# Patient Record
Sex: Female | Born: 1995 | Race: Black or African American | Hispanic: No | Marital: Single | Smoking: Never smoker
Health system: Southern US, Community
[De-identification: ages and names within clinical notes are randomized; demographics above are authoritative.]

## PROBLEM LIST (undated history)

## (undated) ENCOUNTER — Inpatient Hospital Stay (HOSPITAL_COMMUNITY): Payer: Self-pay

## (undated) DIAGNOSIS — S32401A Unspecified fracture of right acetabulum, initial encounter for closed fracture: Secondary | ICD-10-CM

## (undated) DIAGNOSIS — F1290 Cannabis use, unspecified, uncomplicated: Secondary | ICD-10-CM

## (undated) HISTORY — PX: OTHER SURGICAL HISTORY: SHX169

## (undated) HISTORY — PX: BREAST CYST EXCISION: SHX579

---

## 2016-04-09 ENCOUNTER — Inpatient Hospital Stay (HOSPITAL_COMMUNITY)
Admission: EM | Admit: 2016-04-09 | Discharge: 2016-04-15 | DRG: 516 | Disposition: A | Discharge status: Home Health | Payer: BLUE CROSS/BLUE SHIELD | Attending: Orthopedic Surgery | Admitting: Orthopedic Surgery

## 2016-04-09 ENCOUNTER — Emergency Department (HOSPITAL_COMMUNITY): Payer: BLUE CROSS/BLUE SHIELD

## 2016-04-09 ENCOUNTER — Encounter (HOSPITAL_COMMUNITY): Payer: Self-pay | Admitting: Emergency Medicine

## 2016-04-09 ENCOUNTER — Inpatient Hospital Stay (HOSPITAL_COMMUNITY): Payer: BLUE CROSS/BLUE SHIELD

## 2016-04-09 DIAGNOSIS — M542 Cervicalgia: Secondary | ICD-10-CM | POA: Diagnosis present

## 2016-04-09 DIAGNOSIS — Y92410 Unspecified street and highway as the place of occurrence of the external cause: Secondary | ICD-10-CM | POA: Diagnosis not present

## 2016-04-09 DIAGNOSIS — S32491A Other specified fracture of right acetabulum, initial encounter for closed fracture: Secondary | ICD-10-CM | POA: Diagnosis not present

## 2016-04-09 DIAGNOSIS — S0181XA Laceration without foreign body of other part of head, initial encounter: Secondary | ICD-10-CM | POA: Diagnosis present

## 2016-04-09 DIAGNOSIS — S32409A Unspecified fracture of unspecified acetabulum, initial encounter for closed fracture: Secondary | ICD-10-CM | POA: Diagnosis present

## 2016-04-09 DIAGNOSIS — D62 Acute posthemorrhagic anemia: Secondary | ICD-10-CM | POA: Diagnosis not present

## 2016-04-09 DIAGNOSIS — M25511 Pain in right shoulder: Secondary | ICD-10-CM | POA: Diagnosis present

## 2016-04-09 DIAGNOSIS — S32451A Displaced transverse fracture of right acetabulum, initial encounter for closed fracture: Secondary | ICD-10-CM | POA: Diagnosis not present

## 2016-04-09 DIAGNOSIS — S32401A Unspecified fracture of right acetabulum, initial encounter for closed fracture: Secondary | ICD-10-CM | POA: Diagnosis present

## 2016-04-09 DIAGNOSIS — E559 Vitamin D deficiency, unspecified: Secondary | ICD-10-CM | POA: Diagnosis present

## 2016-04-09 DIAGNOSIS — M79601 Pain in right arm: Secondary | ICD-10-CM | POA: Diagnosis not present

## 2016-04-09 DIAGNOSIS — S32401B Unspecified fracture of right acetabulum, initial encounter for open fracture: Secondary | ICD-10-CM | POA: Diagnosis not present

## 2016-04-09 DIAGNOSIS — S32461A Displaced associated transverse-posterior fracture of right acetabulum, initial encounter for closed fracture: Secondary | ICD-10-CM | POA: Diagnosis present

## 2016-04-09 DIAGNOSIS — Z01818 Encounter for other preprocedural examination: Secondary | ICD-10-CM | POA: Diagnosis not present

## 2016-04-09 DIAGNOSIS — M25551 Pain in right hip: Secondary | ICD-10-CM | POA: Diagnosis not present

## 2016-04-09 DIAGNOSIS — F1290 Cannabis use, unspecified, uncomplicated: Secondary | ICD-10-CM | POA: Diagnosis present

## 2016-04-09 DIAGNOSIS — S199XXA Unspecified injury of neck, initial encounter: Secondary | ICD-10-CM | POA: Diagnosis not present

## 2016-04-09 DIAGNOSIS — S32421A Displaced fracture of posterior wall of right acetabulum, initial encounter for closed fracture: Secondary | ICD-10-CM | POA: Diagnosis not present

## 2016-04-09 DIAGNOSIS — T148 Other injury of unspecified body region: Secondary | ICD-10-CM | POA: Diagnosis not present

## 2016-04-09 DIAGNOSIS — S0990XA Unspecified injury of head, initial encounter: Secondary | ICD-10-CM | POA: Diagnosis not present

## 2016-04-09 DIAGNOSIS — S72001A Fracture of unspecified part of neck of right femur, initial encounter for closed fracture: Secondary | ICD-10-CM

## 2016-04-09 HISTORY — DX: Unspecified fracture of right acetabulum, initial encounter for closed fracture: S32.401A

## 2016-04-09 HISTORY — DX: Cannabis use, unspecified, uncomplicated: F12.90

## 2016-04-09 LAB — CBC
HCT: 37.1 % (ref 36.0–46.0)
HEMOGLOBIN: 12.3 g/dL (ref 12.0–15.0)
MCH: 29.4 pg (ref 26.0–34.0)
MCHC: 33.2 g/dL (ref 30.0–36.0)
MCV: 88.8 fL (ref 78.0–100.0)
Platelets: 223 10*3/uL (ref 150–400)
RBC: 4.18 MIL/uL (ref 3.87–5.11)
RDW: 12.2 % (ref 11.5–15.5)
WBC: 7 10*3/uL (ref 4.0–10.5)

## 2016-04-09 LAB — COMPREHENSIVE METABOLIC PANEL
ALK PHOS: 34 U/L — AB (ref 38–126)
ALT: 10 U/L — ABNORMAL LOW (ref 14–54)
ANION GAP: 5 (ref 5–15)
AST: 19 U/L (ref 15–41)
Albumin: 3.3 g/dL — ABNORMAL LOW (ref 3.5–5.0)
BUN: 10 mg/dL (ref 6–20)
CALCIUM: 8.7 mg/dL — AB (ref 8.9–10.3)
CO2: 23 mmol/L (ref 22–32)
Chloride: 110 mmol/L (ref 101–111)
Creatinine, Ser: 0.97 mg/dL (ref 0.44–1.00)
Glucose, Bld: 125 mg/dL — ABNORMAL HIGH (ref 65–99)
Potassium: 3.8 mmol/L (ref 3.5–5.1)
SODIUM: 138 mmol/L (ref 135–145)
TOTAL PROTEIN: 6.5 g/dL (ref 6.5–8.1)
Total Bilirubin: 0.7 mg/dL (ref 0.3–1.2)

## 2016-04-09 LAB — ABO/RH: ABO/RH(D): O POS

## 2016-04-09 LAB — I-STAT CHEM 8, ED
BUN: 11 mg/dL (ref 6–20)
CALCIUM ION: 1.15 mmol/L (ref 1.13–1.30)
CHLORIDE: 106 mmol/L (ref 101–111)
CREATININE: 1.1 mg/dL — AB (ref 0.44–1.00)
GLUCOSE: 126 mg/dL — AB (ref 65–99)
HCT: 36 % (ref 36.0–46.0)
Hemoglobin: 12.2 g/dL (ref 12.0–15.0)
Potassium: 3.8 mmol/L (ref 3.5–5.1)
Sodium: 140 mmol/L (ref 135–145)
TCO2: 22 mmol/L (ref 0–100)

## 2016-04-09 LAB — I-STAT CG4 LACTIC ACID, ED: LACTIC ACID, VENOUS: 1.26 mmol/L (ref 0.5–1.9)

## 2016-04-09 LAB — URINALYSIS, ROUTINE W REFLEX MICROSCOPIC
BILIRUBIN URINE: NEGATIVE
Glucose, UA: NEGATIVE mg/dL
HGB URINE DIPSTICK: NEGATIVE
Ketones, ur: NEGATIVE mg/dL
Leukocytes, UA: NEGATIVE
NITRITE: NEGATIVE
PROTEIN: NEGATIVE mg/dL
Specific Gravity, Urine: 1.023 (ref 1.005–1.030)
pH: 6 (ref 5.0–8.0)

## 2016-04-09 LAB — PREPARE FRESH FROZEN PLASMA
UNIT DIVISION: 0
UNIT DIVISION: 0

## 2016-04-09 LAB — PROTIME-INR
INR: 1.12 (ref 0.00–1.49)
PROTHROMBIN TIME: 14.6 s (ref 11.6–15.2)

## 2016-04-09 LAB — CDS SEROLOGY

## 2016-04-09 LAB — ETHANOL

## 2016-04-09 MED ORDER — BACITRACIN ZINC 500 UNIT/GM EX OINT
TOPICAL_OINTMENT | Freq: Two times a day (BID) | CUTANEOUS | Status: DC
  Administered 2016-04-09: 1 via TOPICAL
  Administered 2016-04-10 (×2): via TOPICAL
  Administered 2016-04-11: 1 via TOPICAL
  Administered 2016-04-12 – 2016-04-14 (×5): via TOPICAL
  Administered 2016-04-14: 1 via TOPICAL
  Administered 2016-04-15: 08:00:00 via TOPICAL
  Filled 2016-04-09: qty 28.35

## 2016-04-09 MED ORDER — ACETAMINOPHEN 650 MG RE SUPP: 650.0000 mg | Freq: Four times a day (QID) | RECTAL | Status: DC | PRN

## 2016-04-09 MED ORDER — HYDROMORPHONE HCL 1 MG/ML IJ SOLN
1.0000 mg | INTRAMUSCULAR | Status: DC | PRN
  Administered 2016-04-09 – 2016-04-13 (×13): 1 mg via INTRAVENOUS
  Filled 2016-04-09 (×14): qty 1

## 2016-04-09 MED ORDER — FENTANYL CITRATE (PF) 100 MCG/2ML IJ SOLN
50.0000 ug | Freq: Once | INTRAMUSCULAR | Status: AC
  Administered 2016-04-09: 50 ug via INTRAVENOUS
  Filled 2016-04-09: qty 2

## 2016-04-09 MED ORDER — ONDANSETRON HCL 4 MG/2ML IJ SOLN
4.0000 mg | Freq: Once | INTRAMUSCULAR | Status: AC
  Administered 2016-04-09: 4 mg via INTRAVENOUS
  Filled 2016-04-09: qty 2

## 2016-04-09 MED ORDER — ACETAMINOPHEN 325 MG PO TABS
650.0000 mg | ORAL_TABLET | Freq: Four times a day (QID) | ORAL | Status: DC | PRN
  Administered 2016-04-10 (×3): 650 mg via ORAL
  Filled 2016-04-09 (×3): qty 2

## 2016-04-09 MED ORDER — FENTANYL CITRATE (PF) 100 MCG/2ML IJ SOLN
50.0000 ug | Freq: Once | INTRAMUSCULAR | Status: AC
Start: 2016-04-09 — End: 2016-04-09
  Administered 2016-04-09: 50 ug via INTRAVENOUS

## 2016-04-09 MED ORDER — METHOCARBAMOL 500 MG PO TABS
500.0000 mg | ORAL_TABLET | Freq: Four times a day (QID) | ORAL | Status: DC | PRN
  Administered 2016-04-10: 500 mg via ORAL
  Filled 2016-04-09: qty 1

## 2016-04-09 MED ORDER — HYDROMORPHONE HCL 1 MG/ML IJ SOLN: 1.0000 mg | Freq: Once | INTRAMUSCULAR | Status: DC

## 2016-04-09 MED ORDER — OXYCODONE HCL 5 MG PO TABS
5.0000 mg | ORAL_TABLET | ORAL | Status: DC | PRN
Start: 2016-04-09 — End: 2016-04-15
  Administered 2016-04-10 – 2016-04-15 (×17): 10 mg via ORAL
  Filled 2016-04-09 (×17): qty 2

## 2016-04-09 MED ORDER — FENTANYL CITRATE (PF) 100 MCG/2ML IJ SOLN
INTRAMUSCULAR | Status: AC
  Administered 2016-04-09: 50 ug via INTRAVENOUS
  Filled 2016-04-09: qty 2

## 2016-04-09 MED ORDER — METHOCARBAMOL 1000 MG/10ML IJ SOLN: 500.0000 mg | Freq: Four times a day (QID) | INTRAMUSCULAR | Status: DC | PRN

## 2016-04-09 MED ORDER — ONDANSETRON HCL 4 MG PO TABS: 4.0000 mg | ORAL_TABLET | Freq: Four times a day (QID) | ORAL | Status: DC | PRN

## 2016-04-09 MED ORDER — ENOXAPARIN SODIUM 40 MG/0.4ML ~~LOC~~ SOLN
40.0000 mg | SUBCUTANEOUS | Status: DC
  Administered 2016-04-09: 40 mg via SUBCUTANEOUS
  Filled 2016-04-09: qty 0.4

## 2016-04-09 MED ORDER — ENOXAPARIN SODIUM 40 MG/0.4ML ~~LOC~~ SOLN: 40.0000 mg | SUBCUTANEOUS | Status: DC

## 2016-04-09 MED ORDER — ONDANSETRON HCL 4 MG/2ML IJ SOLN: 4.0000 mg | Freq: Four times a day (QID) | INTRAMUSCULAR | Status: DC | PRN

## 2016-04-09 MED ORDER — MORPHINE SULFATE (PF) 2 MG/ML IV SOLN: 1.0000 mg | INTRAVENOUS | Status: DC | PRN
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
## 2016-04-09 NOTE — ED Notes (Signed)
Pt VSS at this time, pt transporting to CT. Reports decrease in pain to 6/10.

## 2016-04-09 NOTE — Progress Notes (Signed)
   04/09/16 1500  Clinical Encounter Type  Visited With Patient  Visit Type Trauma  Referral From Nurse  Spiritual Encounters  Spiritual Needs Emotional  Stress Factors  Patient Stress Factors Lack of knowledge;Loss of control;Health changes  Chaplain attended to patient, whose parents live in [place]. Pt unable to remember boyfriend's number and did not have cell phone with her. Offered support and comfort. Linea Calles, Chaplain

## 2016-04-10 ENCOUNTER — Encounter (HOSPITAL_COMMUNITY): Payer: Self-pay | Admitting: Orthopedic Surgery

## 2016-04-10 LAB — TYPE AND SCREEN
ABO/RH(D): O POS
Antibody Screen: NEGATIVE
UNIT DIVISION: 0
UNIT DIVISION: 0

## 2016-04-10 LAB — BLOOD PRODUCT ORDER (VERBAL) VERIFICATION

## 2016-04-10 LAB — MRSA PCR SCREENING: MRSA BY PCR: NEGATIVE

## 2016-04-10 MED ORDER — METHOCARBAMOL 500 MG PO TABS
1000.0000 mg | ORAL_TABLET | Freq: Four times a day (QID) | ORAL | Status: DC
  Administered 2016-04-10 – 2016-04-15 (×19): 1000 mg via ORAL
  Filled 2016-04-10 (×19): qty 2

## 2016-04-10 MED ORDER — CEFAZOLIN SODIUM-DEXTROSE 2-4 GM/100ML-% IV SOLN
2.0000 g | INTRAVENOUS | Status: AC
  Administered 2016-04-11: 2 g via INTRAVENOUS
  Filled 2016-04-10 (×2): qty 100

## 2016-04-10 MED ORDER — HYDROCODONE-ACETAMINOPHEN 5-325 MG PO TABS
1.0000 | ORAL_TABLET | Freq: Four times a day (QID) | ORAL | Status: DC | PRN
  Administered 2016-04-11 – 2016-04-15 (×8): 2 via ORAL
  Filled 2016-04-10 (×9): qty 2

## 2016-04-10 MED ORDER — METHOCARBAMOL 1000 MG/10ML IJ SOLN: 500.0000 mg | Freq: Four times a day (QID) | INTRAVENOUS | Status: DC
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
## 2016-04-11 ENCOUNTER — Encounter (HOSPITAL_COMMUNITY)
Admission: EM | Disposition: A | Discharge status: Home Health | Payer: Self-pay | Source: Home / Self Care | Attending: Orthopedic Surgery

## 2016-04-11 ENCOUNTER — Inpatient Hospital Stay (HOSPITAL_COMMUNITY): Payer: BLUE CROSS/BLUE SHIELD

## 2016-04-11 ENCOUNTER — Inpatient Hospital Stay (HOSPITAL_COMMUNITY): Payer: BLUE CROSS/BLUE SHIELD | Admitting: Anesthesiology

## 2016-04-11 ENCOUNTER — Telehealth: Payer: Self-pay | Admitting: *Deleted

## 2016-04-11 ENCOUNTER — Encounter (HOSPITAL_COMMUNITY): Payer: Self-pay | Admitting: Anesthesiology

## 2016-04-11 HISTORY — PX: ORIF ACETABULAR FRACTURE: SHX5029

## 2016-04-11 LAB — COMPREHENSIVE METABOLIC PANEL
ALK PHOS: 36 U/L — AB (ref 38–126)
ALT: 10 U/L — ABNORMAL LOW (ref 14–54)
ANION GAP: 6 (ref 5–15)
AST: 16 U/L (ref 15–41)
Albumin: 3 g/dL — ABNORMAL LOW (ref 3.5–5.0)
BUN: 6 mg/dL (ref 6–20)
CALCIUM: 8.5 mg/dL — AB (ref 8.9–10.3)
CHLORIDE: 104 mmol/L (ref 101–111)
CO2: 24 mmol/L (ref 22–32)
CREATININE: 0.76 mg/dL (ref 0.44–1.00)
Glucose, Bld: 101 mg/dL — ABNORMAL HIGH (ref 65–99)
Potassium: 3.9 mmol/L (ref 3.5–5.1)
SODIUM: 134 mmol/L — AB (ref 135–145)
Total Bilirubin: 0.6 mg/dL (ref 0.3–1.2)
Total Protein: 6.2 g/dL — ABNORMAL LOW (ref 6.5–8.1)

## 2016-04-11 LAB — CBC WITH DIFFERENTIAL/PLATELET
Basophils Absolute: 0 10*3/uL (ref 0.0–0.1)
Basophils Relative: 0 %
EOS ABS: 0.2 10*3/uL (ref 0.0–0.7)
EOS PCT: 3 %
HCT: 35.4 % — ABNORMAL LOW (ref 36.0–46.0)
Hemoglobin: 11.5 g/dL — ABNORMAL LOW (ref 12.0–15.0)
LYMPHS ABS: 2.3 10*3/uL (ref 0.7–4.0)
LYMPHS PCT: 31 %
MCH: 29.4 pg (ref 26.0–34.0)
MCHC: 32.5 g/dL (ref 30.0–36.0)
MCV: 90.5 fL (ref 78.0–100.0)
MONO ABS: 0.7 10*3/uL (ref 0.1–1.0)
MONOS PCT: 9 %
Neutro Abs: 4.1 10*3/uL (ref 1.7–7.7)
Neutrophils Relative %: 57 %
PLATELETS: 212 10*3/uL (ref 150–400)
RBC: 3.91 MIL/uL (ref 3.87–5.11)
RDW: 12.3 % (ref 11.5–15.5)
WBC: 7.2 10*3/uL (ref 4.0–10.5)

## 2016-04-11 LAB — PROTIME-INR
INR: 1.16 (ref 0.00–1.49)
PROTHROMBIN TIME: 14.9 s (ref 11.6–15.2)

## 2016-04-11 LAB — PREGNANCY, URINE: PREG TEST UR: NEGATIVE

## 2016-04-11 LAB — APTT: APTT: 31 s (ref 24–37)

## 2016-04-11 SURGERY — OPEN REDUCTION INTERNAL FIXATION (ORIF) ACETABULAR FRACTURE
Anesthesia: General | Site: Hip | Laterality: Right

## 2016-04-11 MED ORDER — POTASSIUM CHLORIDE IN NACL 20-0.9 MEQ/L-% IV SOLN
INTRAVENOUS | Status: DC
Start: 2016-04-11 — End: 2016-04-15
  Administered 2016-04-11 – 2016-04-12 (×2): via INTRAVENOUS
  Filled 2016-04-11 (×2): qty 1000

## 2016-04-11 MED ORDER — ACETAMINOPHEN 650 MG RE SUPP: 650.0000 mg | Freq: Four times a day (QID) | RECTAL | Status: DC | PRN

## 2016-04-11 MED ORDER — OXYCODONE HCL 5 MG PO TABS: 10.0000 mg | ORAL_TABLET | Freq: Once | ORAL | Status: DC | PRN

## 2016-04-11 MED ORDER — HYDROMORPHONE 1 MG/ML IV SOLN
INTRAVENOUS | Status: DC
  Administered 2016-04-11: 23:00:00 via INTRAVENOUS
  Administered 2016-04-12: 3.9 mg via INTRAVENOUS
  Administered 2016-04-12: 3.6 mg via INTRAVENOUS

## 2016-04-11 MED ORDER — ROCURONIUM BROMIDE 50 MG/5ML IV SOLN
INTRAVENOUS | Status: AC
  Filled 2016-04-11: qty 1

## 2016-04-11 MED ORDER — MIDAZOLAM HCL 5 MG/5ML IJ SOLN
INTRAMUSCULAR | Status: DC | PRN
  Administered 2016-04-11: 2 mg via INTRAVENOUS

## 2016-04-11 MED ORDER — LIDOCAINE 2% (20 MG/ML) 5 ML SYRINGE
INTRAMUSCULAR | Status: AC
  Filled 2016-04-11: qty 5

## 2016-04-11 MED ORDER — DIPHENHYDRAMINE HCL 50 MG/ML IJ SOLN: 12.5000 mg | Freq: Four times a day (QID) | INTRAMUSCULAR | Status: DC | PRN

## 2016-04-11 MED ORDER — NEOSTIGMINE METHYLSULFATE 5 MG/5ML IV SOSY
PREFILLED_SYRINGE | INTRAVENOUS | Status: AC
  Filled 2016-04-11: qty 5

## 2016-04-11 MED ORDER — LACTATED RINGERS IV SOLN
INTRAVENOUS | Status: DC | PRN
  Administered 2016-04-11 (×2): via INTRAVENOUS

## 2016-04-11 MED ORDER — METOCLOPRAMIDE HCL 5 MG/ML IJ SOLN: 5.0000 mg | Freq: Three times a day (TID) | INTRAMUSCULAR | Status: DC | PRN

## 2016-04-11 MED ORDER — NALOXONE HCL 0.4 MG/ML IJ SOLN: 0.4000 mg | INTRAMUSCULAR | Status: DC | PRN

## 2016-04-11 MED ORDER — WARFARIN - PHARMACIST DOSING INPATIENT: Freq: Every day | Status: DC

## 2016-04-11 MED ORDER — PHENYLEPHRINE HCL 10 MG/ML IJ SOLN
INTRAMUSCULAR | Status: DC | PRN
  Administered 2016-04-11 (×3): 80 ug via INTRAVENOUS
  Administered 2016-04-11: 40 ug via INTRAVENOUS
  Administered 2016-04-11: 80 ug via INTRAVENOUS

## 2016-04-11 MED ORDER — ROCURONIUM BROMIDE 100 MG/10ML IV SOLN
INTRAVENOUS | Status: DC | PRN
  Administered 2016-04-11: 30 mg via INTRAVENOUS
  Administered 2016-04-11: 20 mg via INTRAVENOUS
  Administered 2016-04-11: 50 mg via INTRAVENOUS

## 2016-04-11 MED ORDER — GLYCOPYRROLATE 0.2 MG/ML IJ SOLN
INTRAMUSCULAR | Status: DC | PRN
  Administered 2016-04-11: 0.4 mg via INTRAVENOUS

## 2016-04-11 MED ORDER — CEFAZOLIN IN D5W 1 GM/50ML IV SOLN
1.0000 g | Freq: Four times a day (QID) | INTRAVENOUS | Status: AC
  Administered 2016-04-12 (×3): 1 g via INTRAVENOUS
  Filled 2016-04-11 (×3): qty 50

## 2016-04-11 MED ORDER — MIDAZOLAM HCL 2 MG/2ML IJ SOLN
INTRAMUSCULAR | Status: AC
  Filled 2016-04-11: qty 2

## 2016-04-11 MED ORDER — METOCLOPRAMIDE HCL 5 MG PO TABS: 5.0000 mg | ORAL_TABLET | Freq: Three times a day (TID) | ORAL | Status: DC | PRN

## 2016-04-11 MED ORDER — PHENYLEPHRINE 40 MCG/ML (10ML) SYRINGE FOR IV PUSH (FOR BLOOD PRESSURE SUPPORT)
PREFILLED_SYRINGE | INTRAVENOUS | Status: AC
  Filled 2016-04-11: qty 10

## 2016-04-11 MED ORDER — GLYCOPYRROLATE 0.2 MG/ML IV SOSY
PREFILLED_SYRINGE | INTRAVENOUS | Status: AC
  Filled 2016-04-11: qty 3

## 2016-04-11 MED ORDER — WARFARIN SODIUM 7.5 MG PO TABS
7.5000 mg | ORAL_TABLET | Freq: Once | ORAL | Status: AC
  Administered 2016-04-12: 7.5 mg via ORAL
  Filled 2016-04-11: qty 1

## 2016-04-11 MED ORDER — DIPHENHYDRAMINE HCL 12.5 MG/5ML PO ELIX: 12.5000 mg | ORAL_SOLUTION | Freq: Four times a day (QID) | ORAL | Status: DC | PRN

## 2016-04-11 MED ORDER — ENOXAPARIN SODIUM 40 MG/0.4ML ~~LOC~~ SOLN
40.0000 mg | SUBCUTANEOUS | Status: DC
  Administered 2016-04-12 – 2016-04-13 (×2): 40 mg via SUBCUTANEOUS
  Filled 2016-04-11 (×3): qty 0.4

## 2016-04-11 MED ORDER — HYDROMORPHONE 1 MG/ML IV SOLN
INTRAVENOUS | Status: AC
  Filled 2016-04-11: qty 25

## 2016-04-11 MED ORDER — MAGNESIUM CITRATE PO SOLN: 1.0000 | Freq: Once | ORAL | Status: DC | PRN

## 2016-04-11 MED ORDER — PROPOFOL 10 MG/ML IV BOLUS
INTRAVENOUS | Status: AC
  Filled 2016-04-11: qty 20

## 2016-04-11 MED ORDER — ONDANSETRON HCL 4 MG/2ML IJ SOLN: 4.0000 mg | Freq: Four times a day (QID) | INTRAMUSCULAR | Status: DC | PRN

## 2016-04-11 MED ORDER — ONDANSETRON HCL 4 MG/2ML IJ SOLN
INTRAMUSCULAR | Status: DC | PRN
  Administered 2016-04-11: 4 mg via INTRAVENOUS

## 2016-04-11 MED ORDER — POLYETHYLENE GLYCOL 3350 17 G PO PACK
17.0000 g | PACK | Freq: Every day | ORAL | Status: DC
  Administered 2016-04-13 – 2016-04-15 (×3): 17 g via ORAL
  Filled 2016-04-11 (×4): qty 1

## 2016-04-11 MED ORDER — ACETAMINOPHEN 325 MG PO TABS: 650.0000 mg | ORAL_TABLET | Freq: Four times a day (QID) | ORAL | Status: DC | PRN

## 2016-04-11 MED ORDER — PROPOFOL 10 MG/ML IV BOLUS
INTRAVENOUS | Status: DC | PRN
  Administered 2016-04-11: 200 mg via INTRAVENOUS

## 2016-04-11 MED ORDER — DIPHENHYDRAMINE HCL 25 MG PO CAPS
25.0000 mg | ORAL_CAPSULE | Freq: Four times a day (QID) | ORAL | Status: DC | PRN
  Administered 2016-04-11: 25 mg via ORAL
  Filled 2016-04-11: qty 1

## 2016-04-11 MED ORDER — LIDOCAINE HCL (CARDIAC) 20 MG/ML IV SOLN
INTRAVENOUS | Status: DC | PRN
  Administered 2016-04-11: 60 mg via INTRAVENOUS

## 2016-04-11 MED ORDER — PROMETHAZINE HCL 25 MG/ML IJ SOLN: 6.2500 mg | INTRAMUSCULAR | Status: DC | PRN

## 2016-04-11 MED ORDER — BISACODYL 5 MG PO TBEC
5.0000 mg | DELAYED_RELEASE_TABLET | Freq: Every day | ORAL | Status: DC | PRN
  Administered 2016-04-13: 5 mg via ORAL
  Filled 2016-04-11: qty 1

## 2016-04-11 MED ORDER — HYDROMORPHONE HCL 1 MG/ML IJ SOLN
INTRAMUSCULAR | Status: AC
  Administered 2016-04-11: 0.5 mg via INTRAVENOUS
  Filled 2016-04-11: qty 1

## 2016-04-11 MED ORDER — 0.9 % SODIUM CHLORIDE (POUR BTL) OPTIME
TOPICAL | Status: DC | PRN
  Administered 2016-04-11: 1000 mL

## 2016-04-11 MED ORDER — NEOSTIGMINE METHYLSULFATE 10 MG/10ML IV SOLN
INTRAVENOUS | Status: DC | PRN
  Administered 2016-04-11: 3 mg via INTRAVENOUS

## 2016-04-11 MED ORDER — LACTATED RINGERS IV SOLN
INTRAVENOUS | Status: DC
  Administered 2016-04-11: 17:00:00 via INTRAVENOUS

## 2016-04-11 MED ORDER — SODIUM CHLORIDE 0.9% FLUSH: 9.0000 mL | INTRAVENOUS | Status: DC | PRN

## 2016-04-11 MED ORDER — HYDROMORPHONE HCL 1 MG/ML IJ SOLN
0.2500 mg | INTRAMUSCULAR | Status: DC | PRN
  Administered 2016-04-11 (×3): 0.5 mg via INTRAVENOUS

## 2016-04-11 MED ORDER — FENTANYL CITRATE (PF) 100 MCG/2ML IJ SOLN
INTRAMUSCULAR | Status: DC | PRN
  Administered 2016-04-11 (×3): 50 ug via INTRAVENOUS
  Administered 2016-04-11: 100 ug via INTRAVENOUS

## 2016-04-11 MED ORDER — FENTANYL CITRATE (PF) 250 MCG/5ML IJ SOLN
INTRAMUSCULAR | Status: AC
  Filled 2016-04-11: qty 5

## 2016-04-11 MED ORDER — ONDANSETRON HCL 4 MG PO TABS: 4.0000 mg | ORAL_TABLET | Freq: Four times a day (QID) | ORAL | Status: DC | PRN

## 2016-04-11 MED ORDER — ONDANSETRON HCL 4 MG/2ML IJ SOLN
4.0000 mg | Freq: Four times a day (QID) | INTRAMUSCULAR | Status: DC | PRN
  Administered 2016-04-13: 4 mg via INTRAVENOUS
  Filled 2016-04-11: qty 2

## 2016-04-11 MED ORDER — OXYCODONE HCL 5 MG/5ML PO SOLN: 10.0000 mg | Freq: Once | ORAL | Status: DC | PRN

## 2016-04-11 MED ORDER — DOCUSATE SODIUM 100 MG PO CAPS
100.0000 mg | ORAL_CAPSULE | Freq: Two times a day (BID) | ORAL | Status: DC
  Administered 2016-04-12 – 2016-04-15 (×7): 100 mg via ORAL
  Filled 2016-04-11 (×7): qty 1

## 2016-04-11 SURGICAL SUPPLY — 79 items
APPLIER CLIP 11 MED OPEN (CLIP)
BIT DRILL AO MATTA 2.5MX230M (BIT) ×1 IMPLANT
BIT DRILL STEP 3.5 (DRILL) ×1 IMPLANT
BLADE SURG ROTATE 9660 (MISCELLANEOUS) IMPLANT
BRUSH SCRUB DISP (MISCELLANEOUS) ×4 IMPLANT
CLIP APPLIE 11 MED OPEN (CLIP) IMPLANT
COVER SURGICAL LIGHT HANDLE (MISCELLANEOUS) ×2 IMPLANT
DRAIN CHANNEL 10F 3/8 F FF (DRAIN) IMPLANT
DRAIN CHANNEL 15F RND FF W/TCR (WOUND CARE) IMPLANT
DRAPE C-ARM 42X72 X-RAY (DRAPES) ×2 IMPLANT
DRAPE C-ARMOR (DRAPES) ×2 IMPLANT
DRAPE INCISE IOBAN 85X60 (DRAPES) ×4 IMPLANT
DRAPE ORTHO SPLIT 77X108 STRL (DRAPES) ×2
DRAPE SURG ORHT 6 SPLT 77X108 (DRAPES) ×2 IMPLANT
DRAPE U-SHAPE 47X51 STRL (DRAPES) ×2 IMPLANT
DRILL BIT AO MATTA 2.5MX230M (BIT) ×2
DRILL STEP 3.5 (DRILL) ×2
DRSG MEPILEX BORDER 4X12 (GAUZE/BANDAGES/DRESSINGS) ×2 IMPLANT
DRSG MEPILEX BORDER 4X8 (GAUZE/BANDAGES/DRESSINGS) IMPLANT
ELECT BLADE 6.5 EXT (BLADE) IMPLANT
ELECT REM PT RETURN 9FT ADLT (ELECTROSURGICAL) ×2
ELECTRODE REM PT RTRN 9FT ADLT (ELECTROSURGICAL) ×1 IMPLANT
EVACUATOR 1/8 PVC DRAIN (DRAIN) IMPLANT
EVACUATOR SILICONE 100CC (DRAIN) ×2 IMPLANT
GAUZE SPONGE 4X4 16PLY XRAY LF (GAUZE/BANDAGES/DRESSINGS) IMPLANT
GLOVE BIO SURGEON STRL SZ7.5 (GLOVE) ×2 IMPLANT
GLOVE BIO SURGEON STRL SZ8 (GLOVE) ×2 IMPLANT
GLOVE BIOGEL PI IND STRL 7.5 (GLOVE) ×1 IMPLANT
GLOVE BIOGEL PI IND STRL 8 (GLOVE) ×1 IMPLANT
GLOVE BIOGEL PI INDICATOR 7.5 (GLOVE) ×1
GLOVE BIOGEL PI INDICATOR 8 (GLOVE) ×1
GOWN STRL REUS W/ TWL LRG LVL3 (GOWN DISPOSABLE) ×2 IMPLANT
GOWN STRL REUS W/ TWL XL LVL3 (GOWN DISPOSABLE) ×2 IMPLANT
GOWN STRL REUS W/TWL LRG LVL3 (GOWN DISPOSABLE) ×2
GOWN STRL REUS W/TWL XL LVL3 (GOWN DISPOSABLE) ×2
HANDPIECE INTERPULSE COAX TIP (DISPOSABLE) ×1
KIT BASIN OR (CUSTOM PROCEDURE TRAY) ×2 IMPLANT
KIT ROOM TURNOVER OR (KITS) ×2 IMPLANT
LIGHT ORTHO (MISCELLANEOUS) IMPLANT
LOOP VESSEL MAXI BLUE (MISCELLANEOUS) IMPLANT
MANIFOLD NEPTUNE II (INSTRUMENTS) ×2 IMPLANT
NDL SUT 6 .5 CRC .975X.05 MAYO (NEEDLE) ×1 IMPLANT
NEEDLE MAYO TAPER (NEEDLE) ×1
NS IRRIG 1000ML POUR BTL (IV SOLUTION) ×2 IMPLANT
PACK TOTAL JOINT (CUSTOM PROCEDURE TRAY) ×2 IMPLANT
PAD ARMBOARD 7.5X6 YLW CONV (MISCELLANEOUS) ×4 IMPLANT
PILLOW ABDUCTION HIP (SOFTGOODS) ×2 IMPLANT
PIN APEX 6X180MM EXFIX (EXFIX) ×2 IMPLANT
PLATE ACET STRT 46.5M 4H (Plate) ×2 IMPLANT
PLATE ACET STRT 70.5M 6H (Plate) ×2 IMPLANT
PLATE ACET STRT 94.5M 8H (Plate) ×2 IMPLANT
RETRIEVER SUT HEWSON (MISCELLANEOUS) ×2 IMPLANT
SCREW CORTEX ST MATTA 3.5X20 (Screw) ×2 IMPLANT
SCREW CORTEX ST MATTA 3.5X22MM (Screw) ×2 IMPLANT
SCREW CORTEX ST MATTA 3.5X26MM (Screw) ×6 IMPLANT
SCREW CORTEX ST MATTA 3.5X30MM (Screw) ×8 IMPLANT
SCREW CORTEX ST MATTA 3.5X32MM (Screw) ×4 IMPLANT
SCREW CORTEX ST MATTA 3.5X36MM (Screw) ×2 IMPLANT
SCREW CORTEX ST MATTA 3.5X38M (Screw) ×2 IMPLANT
SCREW CORTEX ST MATTA 3.5X40MM (Screw) ×4 IMPLANT
SET HNDPC FAN SPRY TIP SCT (DISPOSABLE) ×1 IMPLANT
SPONGE LAP 18X18 X RAY DECT (DISPOSABLE) IMPLANT
STAPLER VISISTAT 35W (STAPLE) ×2 IMPLANT
STRIP CLOSURE SKIN 1/2X4 (GAUZE/BANDAGES/DRESSINGS) ×2 IMPLANT
SUCTION FRAZIER HANDLE 10FR (MISCELLANEOUS) ×1
SUCTION TUBE FRAZIER 10FR DISP (MISCELLANEOUS) ×1 IMPLANT
SUT ETHILON 2 0 PSLX (SUTURE) ×4 IMPLANT
SUT FIBERWIRE #2 38 T-5 BLUE (SUTURE) ×4
SUT VIC AB 0 CT1 27 (SUTURE) ×1
SUT VIC AB 0 CT1 27XBRD ANBCTR (SUTURE) ×1 IMPLANT
SUT VIC AB 1 CT1 18XCR BRD 8 (SUTURE) ×1 IMPLANT
SUT VIC AB 1 CT1 8-18 (SUTURE) ×1
SUT VIC AB 2-0 CT1 27 (SUTURE) ×3
SUT VIC AB 2-0 CT1 TAPERPNT 27 (SUTURE) ×3 IMPLANT
SUTURE FIBERWR #2 38 T-5 BLUE (SUTURE) ×2 IMPLANT
TOWEL OR 17X24 6PK STRL BLUE (TOWEL DISPOSABLE) ×2 IMPLANT
TOWEL OR 17X26 10 PK STRL BLUE (TOWEL DISPOSABLE) ×4 IMPLANT
TRAY FOLEY CATH 16FRSI W/METER (SET/KITS/TRAYS/PACK) IMPLANT
WATER STERILE IRR 1000ML POUR (IV SOLUTION) IMPLANT

## 2016-04-11 NOTE — Progress Notes (Signed)
RN received a call to inform her of pt [facility]. Pt should arrived to the facility by 12:45PM. RN to pre-medicate pt with her pain medication prior to transporting. Pt going for Radiation to Hip. P. Seleta RhymesAmo Nechuma Boven RN
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
## 2016-04-11 NOTE — Telephone Encounter (Signed)
   Pre-Radiation Note:  Inpatient nurse name: Gershon Cullriscilla RN, she will let case manager know whose name is [name]  Time Called: 10:43 AM  Inpatient nurse to call [facility]: Yes.    [facility] called to verify transportation: No. Time: will call 04/12/16 in am  Patient Status:   Pain: right hip Pain medication given:  Mobility Orders:  Treatment Site: Right acetabulum  Additional Injuries:   Consent:  [facility] to have tx tomorrow 7//17  Is patient able to sign consent: Yes.   Family member called: No. ,   [name], [name], RN 04/11/2016,10:43 AM

## 2016-04-11 NOTE — Progress Notes (Signed)
Pt transported off unit to OR for surgery. Family at bedside with pt. Dionne BucyP. Amo Kyrstan Gotwalt RN

## 2016-04-11 NOTE — Transfer of Care (Signed)
Immediate Anesthesia Transfer of Care Note  Patient: Kendra [name]  Procedure(s) Performed: Procedure(s): OPEN REDUCTION INTERNAL FIXATION (ORIF) ACETABULAR FRACTURE (Right)  Patient Location: PACU  Anesthesia Type:General  Level of Consciousness: awake  Airway & Oxygen Therapy: Patient Spontanous Breathing  Post-op Assessment: Report given to RN and Post -op Vital signs reviewed and stable  Post vital signs: Reviewed and stable  Last Vitals:  Filed Vitals:   04/11/16 0616 04/11/16 1602  BP: 117/71 120/71  Pulse: 79 80  Temp: 36.8 C 37.2 C  Resp: 16 18    Last Pain:  Filed Vitals:   04/11/16 2206  PainSc: Asleep         Complications: No apparent anesthesia complications

## 2016-04-11 NOTE — Brief Op Note (Signed)
04/09/2016 - 04/11/2016  9:56 PM  PATIENT:  Kendra [name]  19 y.o. female  PRE-OPERATIVE DIAGNOSIS:  RIGHT TRANSVERSE POSTERIOR WALL ACETABULAR FRACTURE  POST-OPERATIVE DIAGNOSIS:  RIGHT TRANSVERSE POSTERIOR WALL ACETABULAR FRACTURE  PROCEDURE:  Procedure(s): OPEN REDUCTION INTERNAL FIXATION (ORIF) RIGHT TRANSVERSE POSTERIOR WALL ACETABULAR FRACTURE (Right)  SURGEON:  Surgeon(s) and Role:    * Myrene GalasMichael Tamzin Bertling, MD - Primary  PHYSICIAN ASSISTANT: Montez MoritaKeith Paul, PA-C  ANESTHESIA:   general  I/O:  Total I/O In: 1000 [I.V.:1000] Out: 300 [Urine:300]  SPECIMEN:  No Specimen  TOURNIQUET:  * No tourniquets in log *  DICTATION: .Other Dictation: Dictation Number [id number]

## 2016-04-11 NOTE — Anesthesia Procedure Notes (Signed)
Procedure Name: Intubation Date/Time: 04/11/2016 6:44 PM Performed by: Gavin PoundLOWDER, Lavin Petteway J Pre-anesthesia Checklist: Patient identified, Emergency Drugs available, Suction available, Patient being monitored and Timeout performed Patient Re-evaluated:Patient Re-evaluated prior to inductionOxygen Delivery Method: Circle system utilized Preoxygenation: Pre-oxygenation with 100% oxygen Intubation Type: IV induction Ventilation: Mask ventilation without difficulty Laryngoscope Size: Mac and 3 Grade View: Grade I Tube type: Oral Tube size: 7.0 mm Number of attempts: 1 Placement Confirmation: ETT inserted through vocal cords under direct vision,  positive ETCO2 and breath sounds checked- equal and bilateral Secured at: 21 cm Tube secured with: Tape Dental Injury: Teeth and Oropharynx as per pre-operative assessment
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
## 2016-04-12 ENCOUNTER — Encounter (HOSPITAL_COMMUNITY): Payer: Self-pay | Admitting: Radiation Oncology

## 2016-04-12 ENCOUNTER — Ambulatory Visit
Admit: 2016-04-12 | Discharge: 2016-04-12 | Disposition: A | Discharge status: Home / Self Care | Payer: BLUE CROSS/BLUE SHIELD | Attending: Radiation Oncology | Admitting: Radiation Oncology

## 2016-04-12 ENCOUNTER — Telehealth: Payer: Self-pay | Admitting: *Deleted

## 2016-04-12 ENCOUNTER — Encounter: Payer: Self-pay | Admitting: *Deleted

## 2016-04-12 ENCOUNTER — Inpatient Hospital Stay (HOSPITAL_COMMUNITY): Payer: BLUE CROSS/BLUE SHIELD

## 2016-04-12 VITALS — BP 128/73 | HR 87 | Temp 98.4°F

## 2016-04-12 DIAGNOSIS — S32401A Unspecified fracture of right acetabulum, initial encounter for closed fracture: Secondary | ICD-10-CM

## 2016-04-12 DIAGNOSIS — S32401B Unspecified fracture of right acetabulum, initial encounter for open fracture: Secondary | ICD-10-CM | POA: Diagnosis not present

## 2016-04-12 LAB — COMPREHENSIVE METABOLIC PANEL
ALBUMIN: 2.8 g/dL — AB (ref 3.5–5.0)
ALK PHOS: 33 U/L — AB (ref 38–126)
ALT: 12 U/L — AB (ref 14–54)
ANION GAP: 5 (ref 5–15)
AST: 32 U/L (ref 15–41)
BUN: <5 mg/dL — ABNORMAL LOW (ref 6–20)
CALCIUM: 8.2 mg/dL — AB (ref 8.9–10.3)
CHLORIDE: 107 mmol/L (ref 101–111)
CO2: 22 mmol/L (ref 22–32)
CREATININE: 0.75 mg/dL (ref 0.44–1.00)
GFR calc Af Amer: >60 mL/min (ref >60)
GFR calc non Af Amer: >60 mL/min (ref >60)
GLUCOSE: 129 mg/dL — AB (ref 65–99)
Potassium: 4.1 mmol/L (ref 3.5–5.1)
SODIUM: 134 mmol/L — AB (ref 135–145)
Total Bilirubin: 0.3 mg/dL (ref 0.3–1.2)
Total Protein: 5.9 g/dL — ABNORMAL LOW (ref 6.5–8.1)

## 2016-04-12 LAB — CBC
HCT: 29.7 % — ABNORMAL LOW (ref 36.0–46.0)
HEMOGLOBIN: 10.1 g/dL — AB (ref 12.0–15.0)
MCH: 30.2 pg (ref 26.0–34.0)
MCHC: 34 g/dL (ref 30.0–36.0)
MCV: 88.9 fL (ref 78.0–100.0)
PLATELETS: 192 10*3/uL (ref 150–400)
RBC: 3.34 MIL/uL — AB (ref 3.87–5.11)
RDW: 12.1 % (ref 11.5–15.5)
WBC: 6.9 10*3/uL (ref 4.0–10.5)

## 2016-04-12 LAB — VITAMIN D 25 HYDROXY (VIT D DEFICIENCY, FRACTURES): VIT D 25 HYDROXY: 13.7 ng/mL — AB (ref 30.0–100.0)

## 2016-04-12 LAB — PROTIME-INR
INR: 1.17 (ref 0.00–1.49)
PROTHROMBIN TIME: 15.1 s (ref 11.6–15.2)

## 2016-04-12 LAB — CALCITRIOL (1,25 DI-OH VIT D): Vit D, 1,25-Dihydroxy: 49.3 pg/mL (ref 19.9–79.3)

## 2016-04-12 MED ORDER — CHOLECALCIFEROL 50 MCG (2000 UT) PO TABS: 2000.0000 [IU] | ORAL_TABLET | Freq: Two times a day (BID) | ORAL | Status: DC

## 2016-04-12 MED ORDER — SODIUM CHLORIDE 0.9% FLUSH
10.0000 mL | Freq: Once | INTRAVENOUS | Status: AC
  Administered 2016-04-12: 10 mL via INTRAVENOUS

## 2016-04-12 MED ORDER — WARFARIN VIDEO: Freq: Once | Status: DC

## 2016-04-12 MED ORDER — SODIUM CHLORIDE 0.9% FLUSH: 10.0000 mL | Freq: Once | INTRAVENOUS | Status: DC

## 2016-04-12 MED ORDER — COUMADIN BOOK
Freq: Once | Status: AC
  Administered 2016-04-15: 09:00:00
  Filled 2016-04-12: qty 1

## 2016-04-12 MED ORDER — METHOCARBAMOL 500 MG PO TABS: 500.0000 mg | ORAL_TABLET | Freq: Four times a day (QID) | ORAL | Status: DC

## 2016-04-12 MED ORDER — OXYCODONE HCL 5 MG PO TABS: 5.0000 mg | ORAL_TABLET | Freq: Four times a day (QID) | ORAL | Status: DC | PRN

## 2016-04-12 MED ORDER — WARFARIN SODIUM 5 MG PO TABS: 5.0000 mg | ORAL_TABLET | Freq: Every day | ORAL | Status: DC

## 2016-04-12 MED ORDER — DOCUSATE SODIUM 100 MG PO CAPS: 100.0000 mg | ORAL_CAPSULE | Freq: Two times a day (BID) | ORAL | Status: DC

## 2016-04-12 MED ORDER — VITAMIN D 1000 UNITS PO TABS
2000.0000 [IU] | ORAL_TABLET | Freq: Two times a day (BID) | ORAL | Status: DC
  Administered 2016-04-12 – 2016-04-15 (×7): 2000 [IU] via ORAL
  Filled 2016-04-12 (×6): qty 2

## 2016-04-12 MED ORDER — HYDROCODONE-ACETAMINOPHEN 5-325 MG PO TABS: 1.0000 | ORAL_TABLET | Freq: Four times a day (QID) | ORAL | Status: DC | PRN

## 2016-04-12 MED ORDER — HYDROMORPHONE HCL 1 MG/ML IJ SOLN
1.0000 mg | Freq: Once | INTRAMUSCULAR | Status: AC
Start: 2016-04-12 — End: 2016-04-12
  Administered 2016-04-12: 1 mg via INTRAVENOUS
  Filled 2016-04-12: qty 1

## 2016-04-12 NOTE — Progress Notes (Signed)
Verified 1mg  Dilaudid syringe with Renaldo ReelKaren Hess RN, per Dr. Mitzi HansenMoody verbal order, pastient pain 10/10 scale  gave 1mg  iv dilaudid slowly to patsint;s RACx 1, then flushed slowly 10ml normal saline,  [facility], called, patient pain after 10 minutes 7/10 scale, patsint stated muscle spasms going up and down right hip and leg, crying, , had 225 UOP with Renaldo ReelKaren Hess Rbn assisteance with bedpan earlier, called [facility]N mc spoke with Ginger RN, patient is on her way back via [facility] trasnportation 2:10 PM
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
## 2016-04-12 NOTE — Progress Notes (Signed)
Called IP [facility] [address] to relay that the covering RN or Care Management need to call [facility], as is the protocol for heterotopic bone irradiation, in order to transport Kendra [name] to Radiation Oncology for a one time treatment to her right acetabulum. The purpose of this treatment is to decrease calcification at the surgical site which would impede mobility in the future. Timing of this treatment is crucial.

## 2016-04-12 NOTE — Telephone Encounter (Signed)
   Pre-Radiation Note:called RN, [facility] hasn't been arranged as yet,I called yesterday to have nurse or case manager to do,   Inpatient nurse name: Ginger  Time Called: 7:53 AM  Inpatient nurse to call [facility]: Yes.  I spoke with Marina Goodellerry [phone] this am ,he stated thi9s hasn't been put on their schedule as yet, then called and spoke with RN Ginger she will let case manager know this am at 9  [facility] called to verify transportation: Yes.   Time: 7:53 AM   Patient Status:   Pain:  Pain medication given:  Mobility Orders:  Treatment Site: 1115am Additional Injuries: none  Consent:    Is patient able to sign consent: Yes.   Family member called: No. Name/Time: *   [name], Zuly Belkin Bruner, RN 04/12/2016,7:53 AM

## 2016-04-12 NOTE — Telephone Encounter (Signed)
Called Maisie [facility]ushomas at [facility] [phone] , he stated no one has called to schedule this pateint as yet, Sonda Rumbleheryl Cheston, RN  Has spoken with the nurse manager to corrdinate terasnfer 9:59 AM

## 2016-04-12 NOTE — Telephone Encounter (Signed)
   Pre-Radiation Transport Note:  IP nurse called: Ginger Time:  04/12/2016,7:51 AM [facility] hasn't been arranged,please either you or case manager to arrange today,patient needs to be here at 1245, RN to inform case mamanger at 9am  IV location:   IV fluids: Yes.  PCA   Last dose of pain medication given (name/time):    [name], Darold Miley Bruner, RN 04/12/2016,7:51 AM

## 2016-04-12 NOTE — Discharge Instructions (Addendum)
Orthopaedic Trauma Service Discharge Instructions   General Discharge Instructions  WEIGHT BEARING STATUS: Touchdown weightbearing Right Leg   RANGE OF MOTION/ACTIVITY: posterior hip precautions right hip   Wound Care: daily wound care as needed. Ok to wash with soap and water only  Discharge Wound Care Instructions  Do NOT apply any ointments, solutions or lotions to pin sites or surgical wounds.  These prevent needed drainage and even though solutions like hydrogen peroxide kill bacteria, they also damage cells lining the pin sites that help fight infection.  Applying lotions or ointments can keep the wounds moist and can cause them to breakdown and open up as well. This can increase the risk for infection. When in doubt call the office.  Surgical incisions should be dressed daily.  If any drainage is noted, use one layer of adaptic, then gauze, Kerlix, and an ace wrap.  Once the incision is completely dry and without drainage, it may be left open to air out.  Showering may begin 36-48 hours later.  Cleaning gently with soap and water.  Traumatic wounds should be dressed daily as well.    One layer of adaptic, gauze, Kerlix, then ace wrap.  The adaptic can be discontinued once the draining has ceased    If you have a wet to dry dressing: wet the gauze with saline the squeeze as much saline out so the gauze is moist (not soaking wet), place moistened gauze over wound, then place a dry gauze over the moist one, followed by Kerlix wrap, then ace wrap.  PAIN MEDICATION USE AND EXPECTATIONS  You have likely been given narcotic medications to help control your pain.  After a traumatic event that results in an fracture (broken bone) with or without surgery, it is ok to use narcotic pain medications to help control one's pain.  We understand that everyone responds to pain differently and each individual patient will be evaluated on a regular basis for the continued need for narcotic medications.  Ideally, narcotic medication use should last no more than 6-8 weeks (coinciding with fracture healing).   As a patient it is your responsibility as well to monitor narcotic medication use and report the amount and frequency you use these medications when you come to your office visit.   We would also advise that if you are using narcotic medications, you should take a dose prior to therapy to maximize you participation.  IF YOU ARE ON NARCOTIC MEDICATIONS IT IS NOT PERMISSIBLE TO OPERATE A MOTOR VEHICLE (MOTORCYCLE/CAR/TRUCK/MOPED) OR HEAVY MACHINERY DO NOT MIX NARCOTICS WITH OTHER CNS (CENTRAL NERVOUS SYSTEM) DEPRESSANTS SUCH AS ALCOHOL  Diet: as you were eating previously.  Can use over the counter stool softeners and bowel preparations, such as Miralax, to help with bowel movements.  Narcotics can be constipating.  Be sure to drink plenty of fluids    STOP SMOKING OR USING NICOTINE PRODUCTS!!!!  As discussed nicotine severely impairs your body's ability to heal surgical and traumatic wounds but also impairs bone healing.  Wounds and bone heal by forming microscopic blood vessels (angiogenesis) and nicotine is a vasoconstrictor (essentially, shrinks blood vessels).  Therefore, if vasoconstriction occurs to these microscopic blood vessels they essentially disappear and are unable to deliver necessary nutrients to the healing tissue.  This is one modifiable factor that you can do to dramatically increase your chances of healing your injury.    (This means no smoking, no nicotine gum, patches, etc)  DO NOT USE NONSTEROIDAL ANTI-INFLAMMATORY DRUGS (NSAID'S)  Using products such as Advil (ibuprofen), Aleve (naproxen), Motrin (ibuprofen) for additional pain control during fracture healing can delay and/or prevent the healing response.  If you would like to take over the counter (OTC) medication, Tylenol (acetaminophen) is ok.  However, some narcotic medications that are given for pain control contain  acetaminophen as well. Therefore, you should not exceed more than 4000 mg of tylenol in a day if you do not have liver disease.  Also note that there are may OTC medicines, such as cold medicines and allergy medicines that my contain tylenol as well.  If you have any questions about medications and/or interactions please ask your doctor/PA or your pharmacist.      ICE AND ELEVATE INJURED/OPERATIVE EXTREMITY  Using ice and elevating the injured extremity above your heart can help with swelling and pain control.  Icing in a pulsatile fashion, such as 20 minutes on and 20 minutes off, can be followed.    Do not place ice directly on skin. Make sure there is a barrier between to skin and the ice pack.    Using frozen items such as frozen peas works well as the conform nicely to the are that needs to be iced.  USE AN ACE WRAP OR TED HOSE FOR SWELLING CONTROL  In addition to icing and elevation, Ace wraps or TED hose are used to help limit and resolve swelling.  It is recommended to use Ace wraps or TED hose until you are informed to stop.    When using Ace Wraps start the wrapping distally (farthest away from the body) and wrap proximally (closer to the body)   Example: If you had surgery on your leg or thing and you do not have a splint on, start the ace wrap at the toes and work your way up to the thigh        If you had surgery on your upper extremity and do not have a splint on, start the ace wrap at your fingers and work your way up to the upper arm  IF YOU ARE IN A SPLINT OR CAST DO NOT REMOVE IT FOR ANY REASON   If your splint gets wet for any reason please contact the office immediately. You may shower in your splint or cast as long as you keep it dry.  This can be done by wrapping in a cast cover or garbage back (or similar)  Do Not stick any thing down your splint or cast such as pencils, money, or hangers to try and scratch yourself with.  If you feel itchy take benadryl as prescribed on the  bottle for itching  IF YOU ARE IN A CAM BOOT (BLACK BOOT)  You may remove boot periodically. Perform daily dressing changes as noted below.  Wash the liner of the boot regularly and wear a sock when wearing the boot. It is recommended that you sleep in the boot until told otherwise  CALL THE OFFICE WITH ANY QUESTIONS OR CONCERNS: [phone]       Information on my medicine - Coumadin   (Warfarin)  This medication education was reviewed with me or my healthcare representative as part of my discharge preparation.  The pharmacist that spoke with me during my hospital stay was:  [facility], Maryanna Shape, [facility]  Why was Coumadin prescribed for you? Coumadin was prescribed for you because you have a blood clot or a medical condition that can cause an increased risk of forming blood clots. Blood clots can cause  serious health problems by blocking the flow of blood to the heart, lung, or brain. Coumadin can prevent harmful blood clots from forming. As a reminder your indication for Coumadin is:   Blood Clot Prevention After Orthopedic Surgery  What test will check on my response to Coumadin? While on Coumadin (warfarin) you will need to have an INR test regularly to ensure that your dose is keeping you in the desired range. The INR (international normalized ratio) number is calculated from the result of the laboratory test called prothrombin time (PT).  If an INR APPOINTMENT HAS NOT ALREADY BEEN MADE FOR YOU please schedule an appointment to have this lab work done by your health care provider within 7 days. Your INR goal is usually a number between:  2 to 3 or your provider may give you a more narrow range like 2-2.5.  Ask your health care provider during an office visit what your goal INR is.  What  do you need to  know  About  COUMADIN? Take Coumadin (warfarin) exactly as prescribed by your healthcare provider about the same time each day.  DO NOT stop taking without talking to the doctor who  prescribed the medication.  Stopping without other blood clot prevention medication to take the place of Coumadin may increase your risk of developing a new clot or stroke.  Get refills before you run out.  What do you do if you miss a dose? If you miss a dose, take it as soon as you remember on the same day then continue your regularly scheduled regimen the next day.  Do not take two doses of Coumadin at the same time.  Important Safety Information A possible side effect of Coumadin (Warfarin) is an increased risk of bleeding. You should call your healthcare provider right away if you experience any of the following: ? Bleeding from an injury or your nose that does not stop. ? Unusual colored urine (red or dark brown) or unusual colored stools (red or black). ? Unusual bruising for unknown reasons. ? A serious fall or if you hit your head (even if there is no bleeding).  Some foods or medicines interact with Coumadin (warfarin) and might alter your response to warfarin. To help avoid this: ? Eat a balanced diet, maintaining a consistent amount of Vitamin K. ? Notify your provider about major diet changes you plan to make. ? Avoid alcohol or limit your intake to 1 drink for women and 2 drinks for men per day. (1 drink is 5 oz. wine, 12 oz. beer, or 1.5 oz. Liquor.)  Do NOT take if become pregnant.  Make sure that ANY health care provider who prescribes medication for you knows that you are taking Coumadin (warfarin).  Also make sure the healthcare provider who is monitoring your Coumadin knows when you have started a new medication including herbals and non-prescription products.  Coumadin (Warfarin)  Major Drug Interactions  Increased Warfarin Effect Decreased Warfarin Effect  Alcohol (large quantities) Antibiotics (esp. Septra/Bactrim, Flagyl, Cipro) Amiodarone (Cordarone) Aspirin (ASA) Cimetidine (Tagamet) Megestrol (Megace) NSAIDs (ibuprofen, naproxen, etc.) Piroxicam  (Feldene) Propafenone (Rythmol SR) Propranolol (Inderal) Isoniazid (INH) Posaconazole (Noxafil) Barbiturates (Phenobarbital) Carbamazepine (Tegretol) Chlordiazepoxide (Librium) Cholestyramine (Questran) Griseofulvin Oral Contraceptives Rifampin Sucralfate (Carafate) Vitamin K   Coumadin (Warfarin) Major Herbal Interactions  Increased Warfarin Effect Decreased Warfarin Effect  Garlic Ginseng Ginkgo biloba Coenzyme Q10 Green tea St. Johns wort    Coumadin (Warfarin) FOOD Interactions  Eat a consistent number of servings per week of  foods HIGH in Vitamin K (1 serving =  cup)  Collards (cooked, or boiled & drained) Kale (cooked, or boiled & drained) Mustard greens (cooked, or boiled & drained) Parsley *serving size only =  cup Spinach (cooked, or boiled & drained) Swiss chard (cooked, or boiled & drained) Turnip greens (cooked, or boiled & drained)  Eat a consistent number of servings per week of foods MEDIUM-HIGH in Vitamin K (1 serving = 1 cup)  Asparagus (cooked, or boiled & drained) Broccoli (cooked, boiled & drained, or raw & chopped) Brussel sprouts (cooked, or boiled & drained) *serving size only =  cup Lettuce, raw (green leaf, endive, romaine) Spinach, raw Turnip greens, raw & chopped   These websites have more information on Coumadin (warfarin):  http://www.king-russell.com/www.coumadin.com; https://www.hines.net/www.ahrq.gov/consumer/coumadin.htm;
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
## 2016-04-12 NOTE — Progress Notes (Signed)
ANTICOAGULATION CONSULT NOTE - Follow Up Consult  Pharmacy Consult for warfarin Indication: VTE prophylaxis  No Known Allergies  Patient Measurements: Height: 5\' 7"  (170.2 cm) Weight: 165 lb (74.844 kg) IBW/kg (Calculated) : 61.6  Vital Signs: Temp: 98.4 F (36.9 C) (07/07 1355) Temp Source: Oral (07/07 1355) BP: 128/73 mmHg (07/07 1355) Pulse Rate: 87 (07/07 1355)  Labs:  Recent Labs  04/11/16 0350 04/12/16 0800 04/12/16 0852  HGB 11.5*  --  10.1*  HCT 35.4*  --  29.7*  PLT 212  --  192  APTT 31  --   --   LABPROT 14.9 15.1  --   INR 1.16 1.17  --   CREATININE 0.76  --  0.75    Estimated Creatinine Clearance: 118.5 mL/min (by C-G formula based on Cr of 0.75).   Assessment: 20 y/o female to begin warfarin for VTE prophylaxis s/p ORIF acetabular fracture. Warfarin ordered late last night was not charted as given.  INR is below goal. No bleeding noted, Hgb down to 10.1, platelets are normal. On Lovenox 40 mg/day also.  Goal of Therapy:  INR 2-3 Monitor platelets by anticoagulation protocol: Yes   Plan:  - Warfarin 7.5 mg PO tonight - INR daily - Monitor for s/sx of bleeding - Warfarin book/video ordered - F/U d/c Lovenox when INR >= 1.8  Loura BackJennifer Prichard, PharmD, BCPS Clinical Pharmacist Pager: [phone] 04/12/2016 3:54 PM

## 2016-04-13 LAB — CBC
HEMATOCRIT: 33.8 % — AB (ref 36.0–46.0)
HEMOGLOBIN: 11.4 g/dL — AB (ref 12.0–15.0)
MCH: 30.1 pg (ref 26.0–34.0)
MCHC: 33.7 g/dL (ref 30.0–36.0)
MCV: 89.2 fL (ref 78.0–100.0)
Platelets: 210 10*3/uL (ref 150–400)
RBC: 3.79 MIL/uL — ABNORMAL LOW (ref 3.87–5.11)
RDW: 12.1 % (ref 11.5–15.5)
WBC: 9.4 10*3/uL (ref 4.0–10.5)

## 2016-04-13 LAB — PROTIME-INR
INR: 1.41 (ref 0.00–1.49)
Prothrombin Time: 17.3 seconds — ABNORMAL HIGH (ref 11.6–15.2)

## 2016-04-13 LAB — PREALBUMIN: PREALBUMIN: 15.4 mg/dL — AB (ref 18–38)

## 2016-04-13 LAB — PHOSPHORUS: Phosphorus: 2.4 mg/dL — ABNORMAL LOW (ref 2.5–4.6)

## 2016-04-13 LAB — TSH: TSH: 0.657 u[IU]/mL (ref 0.350–4.500)

## 2016-04-13 LAB — MAGNESIUM: MAGNESIUM: 1.8 mg/dL (ref 1.7–2.4)

## 2016-04-13 MED ORDER — SENNOSIDES-DOCUSATE SODIUM 8.6-50 MG PO TABS
1.0000 | ORAL_TABLET | Freq: Two times a day (BID) | ORAL | Status: DC
  Administered 2016-04-13 – 2016-04-15 (×4): 1 via ORAL
  Filled 2016-04-13 (×4): qty 1

## 2016-04-13 MED ORDER — WARFARIN SODIUM 5 MG PO TABS
5.0000 mg | ORAL_TABLET | Freq: Once | ORAL | Status: AC
  Administered 2016-04-13: 5 mg via ORAL
  Filled 2016-04-13: qty 1
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
## 2016-04-13 NOTE — Progress Notes (Signed)
Subjective: 2 Days Post-Op Procedure(s) (LRB): OPEN REDUCTION INTERNAL FIXATION (ORIF) ACETABULAR FRACTURE (Right) Patient reports pain as moderate.  Patient still in quite a bit of pain after d/c pca yesterday and transitioning to just oral meds.  She does understand this is in her best interest though.  C/o nasusea but no vomiting.  She is a little lightheaded as well.  No chest pain/sob.  Positive flatus but no bm as of yet.  Objective: Vital signs in last 24 hours: Temp:  [98.4 F (36.9 C)-99.7 F (37.6 C)] 99.7 F (37.6 C) (07/08 0606) Pulse Rate:  [87-102] 102 (07/08 0606) Resp:  [14-15] 15 (07/08 0606) BP: (122-134)/(71-75) 134/75 mmHg (07/08 0606) SpO2:  [98 %-100 %] 99 % (07/08 0606)  Intake/Output from previous day: 07/07 0701 - 07/08 0700 In: 240 [P.O.:240] Out: 225 [Urine:225] Intake/Output this shift:     Recent Labs  04/11/16 0350 04/12/16 0852 04/13/16 0524  HGB 11.5* 10.1* 11.4*    Recent Labs  04/12/16 0852 04/13/16 0524  WBC 6.9 9.4  RBC 3.34* 3.79*  HCT 29.7* 33.8*  PLT 192 210    Recent Labs  04/11/16 0350 04/12/16 0852  NA 134* 134*  K 3.9 4.1  CL 104 107  CO2 24 22  BUN 6 <5*  CREATININE 0.76 0.75  GLUCOSE 101* 129*  CALCIUM 8.5* 8.2*    Recent Labs  04/12/16 0800 04/13/16 0524  INR 1.17 1.41    Neurologically intact Neurovascular intact Sensation intact distally Intact pulses distally Dorsiflexion/Plantar flexion intact Incision: moderate drainage No cellulitis present Compartment soft  Assessment/Plan: 2 Days Post-Op Procedure(s) (LRB): OPEN REDUCTION INTERNAL FIXATION (ORIF) ACETABULAR FRACTURE (Right) Advance diet Up with therapy Discharge home with home health either Sunday or Monday TDWB RLE-posterior hip precautions ABLA-mild and stable DVT ppx-Lovenox to coumadin bridge  Otilio SaberM Lindsey Stanbery 04/13/2016, 10:52 AM
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
## 2016-04-14 LAB — PTH, INTACT AND CALCIUM
CALCIUM TOTAL (PTH): 8.4 mg/dL — AB (ref 8.7–10.2)
PTH: 15 pg/mL (ref 15–65)

## 2016-04-14 LAB — CBC
HEMATOCRIT: 33.1 % — AB (ref 36.0–46.0)
Hemoglobin: 10.7 g/dL — ABNORMAL LOW (ref 12.0–15.0)
MCH: 29.6 pg (ref 26.0–34.0)
MCHC: 32.3 g/dL (ref 30.0–36.0)
MCV: 91.4 fL (ref 78.0–100.0)
PLATELETS: 228 10*3/uL (ref 150–400)
RBC: 3.62 MIL/uL — ABNORMAL LOW (ref 3.87–5.11)
RDW: 12.4 % (ref 11.5–15.5)
WBC: 8.3 10*3/uL (ref 4.0–10.5)

## 2016-04-14 LAB — PROTIME-INR
INR: 2.5 — AB (ref 0.00–1.49)
Prothrombin Time: 26.7 seconds — ABNORMAL HIGH (ref 11.6–15.2)

## 2016-04-14 MED ORDER — WARFARIN SODIUM 1 MG PO TABS: 1.0000 mg | ORAL_TABLET | Freq: Once | ORAL | Status: DC

## 2016-04-14 NOTE — Care Management Note (Signed)
Case Management Note  Patient Details  Name: Weyman Rodneyyia Harold MRN: [id number] Date of Birth: 1996-09-04  Subjective/Objective:                  Right transverse posterior wall acetabular fracture s/p ORIF   Action/Plan: CM spoke with patient and her mother at the bedside. Patient's mother states they have already received the 3N1 and RW and taken them home. [facility]'s [facility] [place] [phone]) [facility] Will fax order, facesheet and H&P. Requested discharge summary with medications at discharge.   Expected Discharge Date:    04/15/16              Expected Discharge Plan:  Home w Home Health Services  In-House Referral:     Discharge planning Services  CM Consult  Post Acute Care Choice:  Home Health Choice offered to:     DME Arranged:  3-N-1, Walker rolling DME Agency:     HH Arranged:  RN, PT [facility]  Status of Service:  Completed, signed off  If discussed at Long Length of Stay Meetings, dates discussed:    Additional Comments:  Antony HasteBennett, Camiah Humm Harris, RN 04/14/2016, 9:24 AM

## 2016-04-14 NOTE — Progress Notes (Signed)
ANTICOAGULATION CONSULT NOTE - Follow Up Consult  Pharmacy Consult for warfarin Indication: VTE prophylaxis  No Known Allergies  Patient Measurements: Height: 5\' 7"  (170.2 cm) Weight: 165 lb (74.844 kg) IBW/kg (Calculated) : 61.6  Vital Signs: Temp: 98.7 F (37.1 C) (07/09 0712) Temp Source: Oral (07/09 0712) BP: 114/66 mmHg (07/09 0712) Pulse Rate: 88 (07/09 0712)  Labs:  Recent Labs  04/12/16 0800  04/12/16 0852 04/13/16 0524 04/14/16 0607  HGB  --   < > 10.1* 11.4* 10.7*  HCT  --   --  29.7* 33.8* 33.1*  PLT  --   --  192 210 228  LABPROT 15.1  --   --  17.3* 26.7*  INR 1.17  --   --  1.41 2.50*  CREATININE  --   --  0.75  --   --   < > = values in this interval not displayed.  Estimated Creatinine Clearance: 118.5 mL/min (by C-G formula based on Cr of 0.75).   Assessment: 20 y/o female to begin warfarin for VTE prophylaxis s/p ORIF acetabular fracture. Today is day 3 of warfarin.  INR increased significantly from 1.17>1.41>2.5. No bleeding noted, Hgb low normal at 10.7, platelets are normal. On Lovenox 40 mg/day also.  Goal of Therapy:  INR 2-3 Monitor platelets by anticoagulation protocol: Yes   Plan:  - Hold warfarin tonight as appears patient may be sensitive - Plan to discharge home with 2.5 mg po daily, home health to follow - INR daily - Monitor for s/sx of bleeding - Continue lovenox despite INR >1.8 as clotting factors not inhibited after only 3 days of Warfarin.  -Warfarin education complete  Allena Katzaroline E Domenick Quebedeaux, PharmD 10:59 AM 04/14/2016

## 2016-04-14 NOTE — Progress Notes (Signed)
Subjective: 3 Days Post-Op Procedure(s) (LRB): OPEN REDUCTION INTERNAL FIXATION (ORIF) ACETABULAR FRACTURE (Right) Patient reports pain as moderate.  Patient up with PT and progressing nicely.  Just completed stair training.    Objective: Vital signs in last 24 hours: Temp:  [98.7 F (37.1 C)-99.1 F (37.3 C)] 98.7 F (37.1 C) (07/09 0712) Pulse Rate:  [88-100] 88 (07/09 0712) Resp:  [16-18] 16 (07/09 0712) BP: (111-128)/(66-78) 114/66 mmHg (07/09 0712) SpO2:  [99 %-100 %] 99 % (07/09 0712)  Intake/Output from previous day:   Intake/Output this shift:     Recent Labs  04/12/16 0852 04/13/16 0524 04/14/16 0607  HGB 10.1* 11.4* 10.7*    Recent Labs  04/13/16 0524 04/14/16 0607  WBC 9.4 8.3  RBC 3.79* 3.62*  HCT 33.8* 33.1*  PLT 210 228    Recent Labs  04/12/16 0852  NA 134*  K 4.1  CL 107  CO2 22  BUN <5*  CREATININE 0.75  GLUCOSE 129*  CALCIUM 8.2*    Recent Labs  04/13/16 0524 04/14/16 0607  INR 1.41 2.50*    Neurologically intact Neurovascular intact Sensation intact distally Dorsiflexion/Plantar flexion intact  Assessment/Plan: 3 Days Post-Op Procedure(s) (LRB): OPEN REDUCTION INTERNAL FIXATION (ORIF) ACETABULAR FRACTURE (Right) Advance diet Up with therapy Discharge home with home health tomorrow.  I discussed this with PT as well as family.  Patient is doing well from PT standpoint, issue remains pain control.  Patient went back on a drip last night after significant increase in pain.  Family feels more comfortable keeping her one more night for pain control. TDWB RLE-posterior hip precautions ABLA-mild and stable  Otilio SaberM Lindsey Stanbery 04/14/2016, 10:54 AM
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
## 2016-04-15 ENCOUNTER — Encounter (HOSPITAL_COMMUNITY): Payer: Self-pay | Admitting: Orthopedic Surgery

## 2016-04-15 DIAGNOSIS — F1290 Cannabis use, unspecified, uncomplicated: Secondary | ICD-10-CM

## 2016-04-15 HISTORY — DX: Cannabis use, unspecified, uncomplicated: F12.90

## 2016-04-15 LAB — PROTIME-INR
INR: 2.6 — ABNORMAL HIGH (ref 0.00–1.49)
Prothrombin Time: 27.5 seconds — ABNORMAL HIGH (ref 11.6–15.2)
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
## 2016-04-15 NOTE — Anesthesia Postprocedure Evaluation (Signed)
Anesthesia Post Note  Patient: Kendra [name]  Procedure(s) Performed: Procedure(s) (LRB): OPEN REDUCTION INTERNAL FIXATION (ORIF) ACETABULAR FRACTURE (Right)  Patient location during evaluation: PACU Anesthesia Type: General Level of consciousness: sedated Pain management: pain level controlled Vital Signs Assessment: post-procedure vital signs reviewed and stable Respiratory status: spontaneous breathing and respiratory function stable Cardiovascular status: stable Anesthetic complications: no            Gara Kincade DANIEL
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
## 2016-04-15 NOTE — Telephone Encounter (Signed)
error 
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
## 2016-04-15 NOTE — Progress Notes (Signed)
ANTICOAGULATION CONSULT NOTE - Follow Up Consult  Pharmacy Consult for warfarin Indication: VTE prophylaxis  No Known Allergies  Patient Measurements: Height: 5\' 7"  (170.2 cm) Weight: 165 lb (74.844 kg) IBW/kg (Calculated) : 61.6  Vital Signs: Temp: 98.5 F (36.9 C) (07/10 0504) Temp Source: Oral (07/10 0504) BP: 115/72 mmHg (07/10 0504) Pulse Rate: 76 (07/10 0504)  Labs:  Recent Labs  04/12/16 0852 04/13/16 0524 04/14/16 0607 04/15/16 0340  HGB 10.1* 11.4* 10.7*  --   HCT 29.7* 33.8* 33.1*  --   PLT 192 210 228  --   LABPROT  --  17.3* 26.7* 27.5*  INR  --  1.41 2.50* 2.60*  CREATININE 0.75  --   --   --     Estimated Creatinine Clearance: 118.5 mL/min (by C-G formula based on Cr of 0.75).   Assessment: 19 yoF on warfarin for VTE prophylaxis s/p ORIF acetabular fracture. Today is day 4 of warfarin. Pt is also on Lovenox 40 until 5 days of warfarin overlap complete.  INR increased significantly from 1.17>1.41>2.5. INR increased today to 2.6 despite dose being held, likely reflective of the 7.5 mg dose pt got on day 1.     Hgb 11.4 > 10.7, Plt 210 > 228, no bleeding noted.   Goal of Therapy:  INR 2-3 Monitor platelets by anticoagulation protocol: Yes   Plan:  - Hold warfarin tonight as appears patient may be sensitive - Plan to discharge home with 2.5 mg po daily, home health to follow - INR daily, CBC q72h - Monitor for s/sx of bleeding - Continue Lovenox despite INR >1.8 as clotting factors not inhibited after only 4 days of Warfarin.  -Warfarin education complete  Arcola JanskyMeagan Sylina Henion, PharmD Clinical Pharmacist Pager: [phone] 7:44 AM 04/15/2016

## 2016-04-16 DIAGNOSIS — Z7901 Long term (current) use of anticoagulants: Secondary | ICD-10-CM | POA: Diagnosis not present

## 2016-04-16 DIAGNOSIS — S32401D Unspecified fracture of right acetabulum, subsequent encounter for fracture with routine healing: Secondary | ICD-10-CM | POA: Diagnosis not present

## 2016-04-16 DIAGNOSIS — M6281 Muscle weakness (generalized): Secondary | ICD-10-CM | POA: Diagnosis not present

## 2016-04-16 DIAGNOSIS — Z5181 Encounter for therapeutic drug level monitoring: Secondary | ICD-10-CM | POA: Diagnosis not present

## 2016-04-16 DIAGNOSIS — R269 Unspecified abnormalities of gait and mobility: Secondary | ICD-10-CM | POA: Diagnosis not present
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
## 2016-04-18 DIAGNOSIS — Z7901 Long term (current) use of anticoagulants: Secondary | ICD-10-CM | POA: Diagnosis not present

## 2016-04-18 DIAGNOSIS — S32401D Unspecified fracture of right acetabulum, subsequent encounter for fracture with routine healing: Secondary | ICD-10-CM | POA: Diagnosis not present

## 2016-04-18 DIAGNOSIS — R269 Unspecified abnormalities of gait and mobility: Secondary | ICD-10-CM | POA: Diagnosis not present

## 2016-04-18 DIAGNOSIS — M6281 Muscle weakness (generalized): Secondary | ICD-10-CM | POA: Diagnosis not present

## 2016-04-18 DIAGNOSIS — Z5181 Encounter for therapeutic drug level monitoring: Secondary | ICD-10-CM | POA: Diagnosis not present

## 2016-04-19 DIAGNOSIS — Z5181 Encounter for therapeutic drug level monitoring: Secondary | ICD-10-CM | POA: Diagnosis not present

## 2016-04-19 DIAGNOSIS — R269 Unspecified abnormalities of gait and mobility: Secondary | ICD-10-CM | POA: Diagnosis not present

## 2016-04-19 DIAGNOSIS — S32401D Unspecified fracture of right acetabulum, subsequent encounter for fracture with routine healing: Secondary | ICD-10-CM | POA: Diagnosis not present

## 2016-04-19 DIAGNOSIS — M6281 Muscle weakness (generalized): Secondary | ICD-10-CM | POA: Diagnosis not present

## 2016-04-19 DIAGNOSIS — Z7901 Long term (current) use of anticoagulants: Secondary | ICD-10-CM | POA: Diagnosis not present

## 2016-04-22 DIAGNOSIS — Z5181 Encounter for therapeutic drug level monitoring: Secondary | ICD-10-CM | POA: Diagnosis not present

## 2016-04-22 DIAGNOSIS — R269 Unspecified abnormalities of gait and mobility: Secondary | ICD-10-CM | POA: Diagnosis not present

## 2016-04-22 DIAGNOSIS — S32401D Unspecified fracture of right acetabulum, subsequent encounter for fracture with routine healing: Secondary | ICD-10-CM | POA: Diagnosis not present

## 2016-04-22 DIAGNOSIS — Z7901 Long term (current) use of anticoagulants: Secondary | ICD-10-CM | POA: Diagnosis not present

## 2016-04-22 DIAGNOSIS — M6281 Muscle weakness (generalized): Secondary | ICD-10-CM | POA: Diagnosis not present

## 2016-04-23 DIAGNOSIS — Z5181 Encounter for therapeutic drug level monitoring: Secondary | ICD-10-CM | POA: Diagnosis not present

## 2016-04-23 DIAGNOSIS — Z7901 Long term (current) use of anticoagulants: Secondary | ICD-10-CM | POA: Diagnosis not present

## 2016-04-23 DIAGNOSIS — R269 Unspecified abnormalities of gait and mobility: Secondary | ICD-10-CM | POA: Diagnosis not present

## 2016-04-23 DIAGNOSIS — S32401D Unspecified fracture of right acetabulum, subsequent encounter for fracture with routine healing: Secondary | ICD-10-CM | POA: Diagnosis not present

## 2016-04-23 DIAGNOSIS — M6281 Muscle weakness (generalized): Secondary | ICD-10-CM | POA: Diagnosis not present

## 2016-04-25 DIAGNOSIS — M6281 Muscle weakness (generalized): Secondary | ICD-10-CM | POA: Diagnosis not present

## 2016-04-25 DIAGNOSIS — R269 Unspecified abnormalities of gait and mobility: Secondary | ICD-10-CM | POA: Diagnosis not present

## 2016-04-25 DIAGNOSIS — Z5181 Encounter for therapeutic drug level monitoring: Secondary | ICD-10-CM | POA: Diagnosis not present

## 2016-04-25 DIAGNOSIS — Z7901 Long term (current) use of anticoagulants: Secondary | ICD-10-CM | POA: Diagnosis not present

## 2016-04-25 DIAGNOSIS — S32401D Unspecified fracture of right acetabulum, subsequent encounter for fracture with routine healing: Secondary | ICD-10-CM | POA: Diagnosis not present

## 2016-04-26 DIAGNOSIS — R269 Unspecified abnormalities of gait and mobility: Secondary | ICD-10-CM | POA: Diagnosis not present

## 2016-04-26 DIAGNOSIS — S32401D Unspecified fracture of right acetabulum, subsequent encounter for fracture with routine healing: Secondary | ICD-10-CM | POA: Diagnosis not present

## 2016-04-26 DIAGNOSIS — Z5181 Encounter for therapeutic drug level monitoring: Secondary | ICD-10-CM | POA: Diagnosis not present

## 2016-04-26 DIAGNOSIS — M6281 Muscle weakness (generalized): Secondary | ICD-10-CM | POA: Diagnosis not present

## 2016-04-26 DIAGNOSIS — Z7901 Long term (current) use of anticoagulants: Secondary | ICD-10-CM | POA: Diagnosis not present

## 2016-04-28 NOTE — Progress Notes (Signed)
  Radiation Oncology         [phone] ________________________________  Name: Kendra [name] MRN: [id number]  Date: 04/12/2016  DOB: 20-Dec-1995  SIMULATION AND TREATMENT PLANNING NOTE  DIAGNOSIS:     ICD-9-CM ICD-10-CM   1. Open displaced fracture of right acetabulum, unspecified portion of acetabulum, initial encounter (HCC) 808.1 S32.401B HYDROmorphone (DILAUDID) injection 1 mg     sodium chloride flush (NS) 0.9 % injection 10 mL     DISCONTINUED: sodium chloride flush (NS) 0.9 % injection 10 mL     Site:  Right hip  NARRATIVE:  The patient was brought to the treatment suite.  Identity was confirmed.  All relevant records and images related to the planned course of therapy were reviewed.   Written consent to proceed with treatment was confirmed which was freely given after reviewing the details related to the planned course of therapy had been reviewed with the patient.  Then, the patient was set-up in a stable reproducible supine position for radiation therapy.  Xrays were obtained of the target hip area.     The images were reviewed and 2 treatment fields were designed and positioned to treat the appropriate target region. A simple isodose plan is requested.  PLAN:  The patient will receive 7 Gy in 1 fraction.    Simulation verification Port films were taken prior to treatment. Each of the treatment fields was reviewed and appropriately delineates the target regions and the patient was appropriate to proceed with radiation treatment.  ________________________________   Radene Gunning, MD, PhD

## 2016-04-28 NOTE — Progress Notes (Signed)
  Radiation Oncology         [phone] ________________________________  Name: Kendra [name] MRN: [id number]  Date: 04/12/2016  DOB: December 14, 1995  End of Treatment Note  Diagnosis:   Postoperative heterotopic ossification     Indication for treatment:  curative       Radiation treatment dates:   04/12/2016  Site/dose:   The patient was treated to a dose of 7 Gy to the right hip. This was accomplished with AP and PA fields.  Narrative: The patient tolerated radiation treatment relatively well.   No difficulties.  Plan: The patient has completed radiation treatment. The patient will return to radiation oncology clinic on an as needed basis. ________________________________  Radene Gunning, M.D., Ph.D.

## 2016-04-28 NOTE — Addendum Note (Signed)
Encounter addended by: Dorothy Puffer, MD on: 04/28/2016 12:34 PM<BR>    Actions taken: Sign clinical note

## 2016-04-30 DIAGNOSIS — M6281 Muscle weakness (generalized): Secondary | ICD-10-CM | POA: Diagnosis not present

## 2016-04-30 DIAGNOSIS — Z7901 Long term (current) use of anticoagulants: Secondary | ICD-10-CM | POA: Diagnosis not present

## 2016-04-30 DIAGNOSIS — R269 Unspecified abnormalities of gait and mobility: Secondary | ICD-10-CM | POA: Diagnosis not present

## 2016-04-30 DIAGNOSIS — S32401D Unspecified fracture of right acetabulum, subsequent encounter for fracture with routine healing: Secondary | ICD-10-CM | POA: Diagnosis not present

## 2016-04-30 DIAGNOSIS — Z5181 Encounter for therapeutic drug level monitoring: Secondary | ICD-10-CM | POA: Diagnosis not present

## 2016-05-01 DIAGNOSIS — S32451D Displaced transverse fracture of right acetabulum, subsequent encounter for fracture with routine healing: Secondary | ICD-10-CM | POA: Diagnosis not present

## 2016-05-02 DIAGNOSIS — Z7901 Long term (current) use of anticoagulants: Secondary | ICD-10-CM | POA: Diagnosis not present

## 2016-05-02 DIAGNOSIS — Z5181 Encounter for therapeutic drug level monitoring: Secondary | ICD-10-CM | POA: Diagnosis not present

## 2016-05-02 DIAGNOSIS — S32401D Unspecified fracture of right acetabulum, subsequent encounter for fracture with routine healing: Secondary | ICD-10-CM | POA: Diagnosis not present

## 2016-05-02 DIAGNOSIS — R269 Unspecified abnormalities of gait and mobility: Secondary | ICD-10-CM | POA: Diagnosis not present

## 2016-05-02 DIAGNOSIS — M6281 Muscle weakness (generalized): Secondary | ICD-10-CM | POA: Diagnosis not present

## 2016-05-03 DIAGNOSIS — Z5181 Encounter for therapeutic drug level monitoring: Secondary | ICD-10-CM | POA: Diagnosis not present

## 2016-05-03 DIAGNOSIS — R269 Unspecified abnormalities of gait and mobility: Secondary | ICD-10-CM | POA: Diagnosis not present

## 2016-05-03 DIAGNOSIS — M6281 Muscle weakness (generalized): Secondary | ICD-10-CM | POA: Diagnosis not present

## 2016-05-03 DIAGNOSIS — Z7901 Long term (current) use of anticoagulants: Secondary | ICD-10-CM | POA: Diagnosis not present

## 2016-05-03 DIAGNOSIS — S32401D Unspecified fracture of right acetabulum, subsequent encounter for fracture with routine healing: Secondary | ICD-10-CM | POA: Diagnosis not present

## 2016-05-07 DIAGNOSIS — Z5181 Encounter for therapeutic drug level monitoring: Secondary | ICD-10-CM | POA: Diagnosis not present

## 2016-05-07 DIAGNOSIS — R269 Unspecified abnormalities of gait and mobility: Secondary | ICD-10-CM | POA: Diagnosis not present

## 2016-05-07 DIAGNOSIS — S32401D Unspecified fracture of right acetabulum, subsequent encounter for fracture with routine healing: Secondary | ICD-10-CM | POA: Diagnosis not present

## 2016-05-07 DIAGNOSIS — Z7901 Long term (current) use of anticoagulants: Secondary | ICD-10-CM | POA: Diagnosis not present

## 2016-05-07 DIAGNOSIS — M6281 Muscle weakness (generalized): Secondary | ICD-10-CM | POA: Diagnosis not present

## 2016-05-09 DIAGNOSIS — Z5181 Encounter for therapeutic drug level monitoring: Secondary | ICD-10-CM | POA: Diagnosis not present

## 2016-05-09 DIAGNOSIS — S32401D Unspecified fracture of right acetabulum, subsequent encounter for fracture with routine healing: Secondary | ICD-10-CM | POA: Diagnosis not present

## 2016-05-09 DIAGNOSIS — R269 Unspecified abnormalities of gait and mobility: Secondary | ICD-10-CM | POA: Diagnosis not present

## 2016-05-09 DIAGNOSIS — Z7901 Long term (current) use of anticoagulants: Secondary | ICD-10-CM | POA: Diagnosis not present

## 2016-05-09 DIAGNOSIS — M6281 Muscle weakness (generalized): Secondary | ICD-10-CM | POA: Diagnosis not present

## 2016-05-10 DIAGNOSIS — Z7901 Long term (current) use of anticoagulants: Secondary | ICD-10-CM | POA: Diagnosis not present

## 2016-05-10 DIAGNOSIS — Z5181 Encounter for therapeutic drug level monitoring: Secondary | ICD-10-CM | POA: Diagnosis not present

## 2016-05-10 DIAGNOSIS — R269 Unspecified abnormalities of gait and mobility: Secondary | ICD-10-CM | POA: Diagnosis not present

## 2016-05-10 DIAGNOSIS — S32401D Unspecified fracture of right acetabulum, subsequent encounter for fracture with routine healing: Secondary | ICD-10-CM | POA: Diagnosis not present

## 2016-05-10 DIAGNOSIS — M6281 Muscle weakness (generalized): Secondary | ICD-10-CM | POA: Diagnosis not present

## 2016-05-11 DIAGNOSIS — S6000XA Contusion of unspecified finger without damage to nail, initial encounter: Secondary | ICD-10-CM | POA: Diagnosis not present

## 2016-05-14 DIAGNOSIS — S32401D Unspecified fracture of right acetabulum, subsequent encounter for fracture with routine healing: Secondary | ICD-10-CM | POA: Diagnosis not present

## 2016-05-14 DIAGNOSIS — Z5181 Encounter for therapeutic drug level monitoring: Secondary | ICD-10-CM | POA: Diagnosis not present

## 2016-05-14 DIAGNOSIS — Z7901 Long term (current) use of anticoagulants: Secondary | ICD-10-CM | POA: Diagnosis not present

## 2016-05-14 DIAGNOSIS — R269 Unspecified abnormalities of gait and mobility: Secondary | ICD-10-CM | POA: Diagnosis not present

## 2016-05-14 DIAGNOSIS — M6281 Muscle weakness (generalized): Secondary | ICD-10-CM | POA: Diagnosis not present

## 2016-05-16 DIAGNOSIS — Z0000 Encounter for general adult medical examination without abnormal findings: Secondary | ICD-10-CM | POA: Diagnosis not present

## 2016-05-16 DIAGNOSIS — S72001A Fracture of unspecified part of neck of right femur, initial encounter for closed fracture: Secondary | ICD-10-CM | POA: Diagnosis not present

## 2016-05-17 DIAGNOSIS — R269 Unspecified abnormalities of gait and mobility: Secondary | ICD-10-CM | POA: Diagnosis not present

## 2016-05-17 DIAGNOSIS — S32401D Unspecified fracture of right acetabulum, subsequent encounter for fracture with routine healing: Secondary | ICD-10-CM | POA: Diagnosis not present

## 2016-05-17 DIAGNOSIS — Z7901 Long term (current) use of anticoagulants: Secondary | ICD-10-CM | POA: Diagnosis not present

## 2016-05-17 DIAGNOSIS — Z5181 Encounter for therapeutic drug level monitoring: Secondary | ICD-10-CM | POA: Diagnosis not present

## 2016-05-17 DIAGNOSIS — M6281 Muscle weakness (generalized): Secondary | ICD-10-CM | POA: Diagnosis not present

## 2016-05-20 DIAGNOSIS — S32451D Displaced transverse fracture of right acetabulum, subsequent encounter for fracture with routine healing: Secondary | ICD-10-CM | POA: Diagnosis not present

## 2016-05-21 DIAGNOSIS — R269 Unspecified abnormalities of gait and mobility: Secondary | ICD-10-CM | POA: Diagnosis not present

## 2016-05-21 DIAGNOSIS — S32401D Unspecified fracture of right acetabulum, subsequent encounter for fracture with routine healing: Secondary | ICD-10-CM | POA: Diagnosis not present

## 2016-05-21 DIAGNOSIS — M6281 Muscle weakness (generalized): Secondary | ICD-10-CM | POA: Diagnosis not present

## 2016-05-21 DIAGNOSIS — Z5181 Encounter for therapeutic drug level monitoring: Secondary | ICD-10-CM | POA: Diagnosis not present

## 2016-05-21 DIAGNOSIS — Z7901 Long term (current) use of anticoagulants: Secondary | ICD-10-CM | POA: Diagnosis not present

## 2016-05-30 DIAGNOSIS — Z5181 Encounter for therapeutic drug level monitoring: Secondary | ICD-10-CM | POA: Diagnosis not present

## 2016-05-30 DIAGNOSIS — Z7901 Long term (current) use of anticoagulants: Secondary | ICD-10-CM | POA: Diagnosis not present

## 2016-05-30 DIAGNOSIS — R269 Unspecified abnormalities of gait and mobility: Secondary | ICD-10-CM | POA: Diagnosis not present

## 2016-05-30 DIAGNOSIS — S32401D Unspecified fracture of right acetabulum, subsequent encounter for fracture with routine healing: Secondary | ICD-10-CM | POA: Diagnosis not present

## 2016-05-30 DIAGNOSIS — M6281 Muscle weakness (generalized): Secondary | ICD-10-CM | POA: Diagnosis not present

## 2016-06-13 DIAGNOSIS — M6281 Muscle weakness (generalized): Secondary | ICD-10-CM | POA: Diagnosis not present

## 2016-06-13 DIAGNOSIS — S32401D Unspecified fracture of right acetabulum, subsequent encounter for fracture with routine healing: Secondary | ICD-10-CM | POA: Diagnosis not present

## 2016-06-13 DIAGNOSIS — Z5181 Encounter for therapeutic drug level monitoring: Secondary | ICD-10-CM | POA: Diagnosis not present

## 2016-06-13 DIAGNOSIS — R269 Unspecified abnormalities of gait and mobility: Secondary | ICD-10-CM | POA: Diagnosis not present

## 2016-06-13 DIAGNOSIS — Z7901 Long term (current) use of anticoagulants: Secondary | ICD-10-CM | POA: Diagnosis not present

## 2016-06-19 DIAGNOSIS — S32451D Displaced transverse fracture of right acetabulum, subsequent encounter for fracture with routine healing: Secondary | ICD-10-CM | POA: Diagnosis not present

## 2016-07-03 DIAGNOSIS — R531 Weakness: Secondary | ICD-10-CM | POA: Diagnosis not present

## 2016-07-03 DIAGNOSIS — M25551 Pain in right hip: Secondary | ICD-10-CM | POA: Diagnosis not present

## 2016-07-03 DIAGNOSIS — R262 Difficulty in walking, not elsewhere classified: Secondary | ICD-10-CM | POA: Diagnosis not present

## 2016-07-03 DIAGNOSIS — S32401D Unspecified fracture of right acetabulum, subsequent encounter for fracture with routine healing: Secondary | ICD-10-CM | POA: Diagnosis not present

## 2016-07-08 DIAGNOSIS — M25551 Pain in right hip: Secondary | ICD-10-CM | POA: Diagnosis not present

## 2016-07-08 DIAGNOSIS — S32401D Unspecified fracture of right acetabulum, subsequent encounter for fracture with routine healing: Secondary | ICD-10-CM | POA: Diagnosis not present

## 2016-07-08 DIAGNOSIS — R262 Difficulty in walking, not elsewhere classified: Secondary | ICD-10-CM | POA: Diagnosis not present

## 2016-07-08 DIAGNOSIS — R531 Weakness: Secondary | ICD-10-CM | POA: Diagnosis not present

## 2016-07-15 DIAGNOSIS — M25551 Pain in right hip: Secondary | ICD-10-CM | POA: Diagnosis not present

## 2016-07-15 DIAGNOSIS — R531 Weakness: Secondary | ICD-10-CM | POA: Diagnosis not present

## 2016-07-15 DIAGNOSIS — R262 Difficulty in walking, not elsewhere classified: Secondary | ICD-10-CM | POA: Diagnosis not present

## 2016-07-15 DIAGNOSIS — S32401D Unspecified fracture of right acetabulum, subsequent encounter for fracture with routine healing: Secondary | ICD-10-CM | POA: Diagnosis not present

## 2016-07-22 DIAGNOSIS — R531 Weakness: Secondary | ICD-10-CM | POA: Diagnosis not present

## 2016-07-22 DIAGNOSIS — S32401D Unspecified fracture of right acetabulum, subsequent encounter for fracture with routine healing: Secondary | ICD-10-CM | POA: Diagnosis not present

## 2016-07-22 DIAGNOSIS — R262 Difficulty in walking, not elsewhere classified: Secondary | ICD-10-CM | POA: Diagnosis not present

## 2016-07-22 DIAGNOSIS — M25551 Pain in right hip: Secondary | ICD-10-CM | POA: Diagnosis not present

## 2016-07-24 DIAGNOSIS — S32451D Displaced transverse fracture of right acetabulum, subsequent encounter for fracture with routine healing: Secondary | ICD-10-CM | POA: Diagnosis not present

## 2016-07-24 DIAGNOSIS — M25551 Pain in right hip: Secondary | ICD-10-CM | POA: Diagnosis not present

## 2016-07-24 DIAGNOSIS — R262 Difficulty in walking, not elsewhere classified: Secondary | ICD-10-CM | POA: Diagnosis not present

## 2016-07-24 DIAGNOSIS — S32401D Unspecified fracture of right acetabulum, subsequent encounter for fracture with routine healing: Secondary | ICD-10-CM | POA: Diagnosis not present

## 2016-07-24 DIAGNOSIS — R531 Weakness: Secondary | ICD-10-CM | POA: Diagnosis not present

## 2016-07-29 DIAGNOSIS — M25551 Pain in right hip: Secondary | ICD-10-CM | POA: Diagnosis not present

## 2016-07-29 DIAGNOSIS — R531 Weakness: Secondary | ICD-10-CM | POA: Diagnosis not present

## 2016-07-29 DIAGNOSIS — R262 Difficulty in walking, not elsewhere classified: Secondary | ICD-10-CM | POA: Diagnosis not present

## 2016-07-29 DIAGNOSIS — S32401D Unspecified fracture of right acetabulum, subsequent encounter for fracture with routine healing: Secondary | ICD-10-CM | POA: Diagnosis not present

## 2016-07-31 DIAGNOSIS — M25551 Pain in right hip: Secondary | ICD-10-CM | POA: Diagnosis not present

## 2016-07-31 DIAGNOSIS — R531 Weakness: Secondary | ICD-10-CM | POA: Diagnosis not present

## 2016-07-31 DIAGNOSIS — S32401D Unspecified fracture of right acetabulum, subsequent encounter for fracture with routine healing: Secondary | ICD-10-CM | POA: Diagnosis not present

## 2016-07-31 DIAGNOSIS — R262 Difficulty in walking, not elsewhere classified: Secondary | ICD-10-CM | POA: Diagnosis not present

## 2016-08-07 DIAGNOSIS — R262 Difficulty in walking, not elsewhere classified: Secondary | ICD-10-CM | POA: Diagnosis not present

## 2016-08-07 DIAGNOSIS — R531 Weakness: Secondary | ICD-10-CM | POA: Diagnosis not present

## 2016-08-07 DIAGNOSIS — S32401D Unspecified fracture of right acetabulum, subsequent encounter for fracture with routine healing: Secondary | ICD-10-CM | POA: Diagnosis not present

## 2016-08-07 DIAGNOSIS — M25551 Pain in right hip: Secondary | ICD-10-CM | POA: Diagnosis not present

## 2016-08-14 DIAGNOSIS — R262 Difficulty in walking, not elsewhere classified: Secondary | ICD-10-CM | POA: Diagnosis not present

## 2016-08-14 DIAGNOSIS — S32401D Unspecified fracture of right acetabulum, subsequent encounter for fracture with routine healing: Secondary | ICD-10-CM | POA: Diagnosis not present

## 2016-08-14 DIAGNOSIS — R531 Weakness: Secondary | ICD-10-CM | POA: Diagnosis not present

## 2016-08-14 DIAGNOSIS — M25551 Pain in right hip: Secondary | ICD-10-CM | POA: Diagnosis not present

## 2016-10-22 ENCOUNTER — Ambulatory Visit (INDEPENDENT_AMBULATORY_CARE_PROVIDER_SITE_OTHER): Payer: BLUE CROSS/BLUE SHIELD | Admitting: Physician Assistant

## 2016-10-22 VITALS — BP 112/82 | HR 84 | Temp 97.9°F | Resp 18 | Ht 67.0 in | Wt 171.0 lb

## 2016-10-22 DIAGNOSIS — R05 Cough: Secondary | ICD-10-CM | POA: Diagnosis not present

## 2016-10-22 DIAGNOSIS — R0981 Nasal congestion: Secondary | ICD-10-CM | POA: Diagnosis not present

## 2016-10-22 DIAGNOSIS — R059 Cough, unspecified: Secondary | ICD-10-CM

## 2016-10-22 DIAGNOSIS — Z20828 Contact with and (suspected) exposure to other viral communicable diseases: Secondary | ICD-10-CM | POA: Diagnosis not present

## 2016-10-22 LAB — POCT CBC
GRANULOCYTE PERCENT: 51.5 % (ref 37–80)
HCT, POC: 37.3 % — AB (ref 37.7–47.9)
Hemoglobin: 13.2 g/dL (ref 12.2–16.2)
Lymph, poc: 1.5 (ref 0.6–3.4)
MCH, POC: 30.6 pg (ref 27–31.2)
MCHC: 35.5 g/dL — AB (ref 31.8–35.4)
MCV: 86.2 fL (ref 80–97)
MID (cbc): 0.5 (ref 0–0.9)
MPV: 7.2 fL (ref 0–99.8)
PLATELET COUNT, POC: 239 10*3/uL (ref 142–424)
POC Granulocyte: 2.1 (ref 2–6.9)
POC LYMPH %: 36.2 % (ref 10–50)
POC MID %: 12.3 %M — AB (ref 0–12)
RBC: 4.33 M/uL (ref 4.04–5.48)
RDW, POC: 12.9 %
WBC: 4.1 10*3/uL — AB (ref 4.6–10.2)

## 2016-10-22 LAB — POCT INFLUENZA A/B
INFLUENZA A, POC: NEGATIVE
INFLUENZA B, POC: NEGATIVE

## 2016-10-22 MED ORDER — HYDROCOD POLST-CPM POLST ER 10-8 MG/5ML PO SUER: 5.0000 mL | Freq: Two times a day (BID) | ORAL | 0 refills | Status: DC | PRN

## 2016-10-22 MED ORDER — FLUTICASONE PROPIONATE 50 MCG/ACT NA SUSP: 2.0000 | Freq: Every day | NASAL | 0 refills | Status: DC

## 2016-10-22 MED ORDER — BENZONATATE 100 MG PO CAPS: 100.0000 mg | ORAL_CAPSULE | Freq: Three times a day (TID) | ORAL | 0 refills | Status: DC | PRN
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
## 2016-10-22 NOTE — Patient Instructions (Addendum)
- We will treat this as a respiratory viral infection.  - I recommend you rest, drink plenty of fluids, eat light meals including soups.  -Use flonase daily for nasal congestion.  - You may use cough syrup at night for your cough and sore throat, Tessalon pearls during the day. Be aware that cough syrup can definitely make you drowsy and sleepy so do not drive or operate any heavy machinery if it is affecting you during the day.  - You may also use Tylenol or ibuprofen over-the-counter for your sore throat.  - Please let me know if you are not seeing any improvement or get worse in 5-7 days.    Cough, Adult Introduction A cough helps to clear your throat and lungs. A cough may last only 2-3 weeks (acute), or it may last longer than 8 weeks (chronic). Many different things can cause a cough. A cough may be a sign of an illness or another medical condition. Follow these instructions at home:  Pay attention to any changes in your cough.  Take medicines only as told by your doctor.  If you were prescribed an antibiotic medicine, take it as told by your doctor. Do not stop taking it even if you start to feel better.  Talk with your doctor before you try using a cough medicine.  Drink enough fluid to keep your pee (urine) clear or pale yellow.  If the air is dry, use a cold steam vaporizer or humidifier in your home.  Stay away from things that make you cough at work or at home.  If your cough is worse at night, try using extra pillows to raise your head up higher while you sleep.  Do not smoke, and try not to be around smoke. If you need help quitting, ask your doctor.  Do not have caffeine.  Do not drink alcohol.  Rest as needed. Contact a doctor if:  You have new problems (symptoms).  You cough up yellow fluid (pus).  Your cough does not get better after 2-3 weeks, or your cough gets worse.  Medicine does not help your cough and you are not sleeping well.  You have pain  that gets worse or pain that is not helped with medicine.  You have a fever.  You are losing weight and you do not know why.  You have night sweats. Get help right away if:  You cough up blood.  You have trouble breathing.  Your heartbeat is very fast. This information is not intended to replace advice given to you by your health care provider. Make sure you discuss any questions you have with your health care provider. Document Released: 06/06/2011 Document Revised: 02/29/2016 Document Reviewed: 11/30/2014  2017 Elsevier    IF you received an x-ray today, you will receive an invoice from [facility]. Please contact [facility] at [phone] with questions or concerns regarding your invoice.   IF you received labwork today, you will receive an invoice from [facility]. Please contact [facility] at [phone] with questions or concerns regarding your invoice.   Our billing staff will not be able to assist you with questions regarding bills from these companies.  You will be contacted with the lab results as soon as they are available. The fastest way to get your results is to activate your My Chart account. Instructions are located on the last page of this paperwork. If you have not heard from us regarding the results in 2 weeks, please contact this office.

## 2016-10-23 ENCOUNTER — Telehealth: Payer: Self-pay | Admitting: Family Medicine

## 2016-10-23 NOTE — Telephone Encounter (Signed)
Spoke with patient --- had a headache again last night; woke up this morning with temperature at 99 and then increased to 101.   Fever is new; started this morning.No new symptoms other than the fever and excessive fatigue; "my whole body hurts".  +Ha; neck tender; +ST chronic.  Coughing the entire. Took 4 motrin at 2:00am; has been dizzy; headache is back.  No Tylenol.  No other medications in the house.  A/P: likely influenza:  New onset this morning; evaluated yesterday with one week of URI symptoms.  Currently snowing; recommend Ibuprofen 800mg  alternating with Tylenol 1000mg  every four hours; continue Tessalon Perles and Flonase.  Recommend rest, fluids, BRAT diet.  To ED for acute decline.  Call for appointment when open tomorrow.  [facility].

## 2016-10-25 NOTE — Telephone Encounter (Signed)
Pt feeling better. Notes she is having some chest discomfort post cough. Recommended she continue ibuprofen as prescribed and rest. She is wondering if she can go back to work on Sunday. Instructed that she needs to be fever free for 24 hours without having to use antipyretic before going back to work. Pt voices understanding.

## 2016-12-02 ENCOUNTER — Ambulatory Visit (INDEPENDENT_AMBULATORY_CARE_PROVIDER_SITE_OTHER): Payer: BLUE CROSS/BLUE SHIELD | Admitting: Physician Assistant

## 2016-12-02 ENCOUNTER — Encounter: Payer: Self-pay | Admitting: Physician Assistant

## 2016-12-02 VITALS — BP 115/77 | HR 90 | Temp 97.7°F | Resp 16 | Ht 67.0 in | Wt 169.0 lb

## 2016-12-02 DIAGNOSIS — R05 Cough: Secondary | ICD-10-CM

## 2016-12-02 DIAGNOSIS — R059 Cough, unspecified: Secondary | ICD-10-CM

## 2016-12-02 DIAGNOSIS — J029 Acute pharyngitis, unspecified: Secondary | ICD-10-CM

## 2016-12-02 DIAGNOSIS — R0981 Nasal congestion: Secondary | ICD-10-CM | POA: Diagnosis not present

## 2016-12-02 MED ORDER — BENZONATATE 100 MG PO CAPS: 100.0000 mg | ORAL_CAPSULE | Freq: Three times a day (TID) | ORAL | 0 refills | Status: DC | PRN

## 2016-12-02 MED ORDER — MUCINEX DM MAXIMUM STRENGTH 60-1200 MG PO TB12: 1.0000 | ORAL_TABLET | Freq: Two times a day (BID) | ORAL | 1 refills | Status: DC

## 2016-12-02 MED ORDER — FLUTICASONE PROPIONATE 50 MCG/ACT NA SUSP: 2.0000 | Freq: Every day | NASAL | 0 refills | Status: DC
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
## 2016-12-02 NOTE — Patient Instructions (Addendum)
Flonase: 2 sprays at night before bed. You may repeat this during the day as needed for nasal congestion.  Mucinex - take this as directed. Please drink 1-2 liters of water during the day.  Warm tea with honey and lemon for sore throat Tessalon is for cough during the day.  Please come back if you are not better in 5-7 days.  Thank you for coming in today. I hope you feel we met your needs.  Feel free to call [facility] if you have any questions or further requests.  Please consider signing up for MyChart if you do not already have it, as this is a great way to communicate with me.  Best,  Whitney McVey, PA-C  IF you received an x-ray today, you will receive an invoice from [facility]. Please contact [facility] at [phone] with questions or concerns regarding your invoice.   IF you received labwork today, you will receive an invoice from [facility]. Please contact [facility] at [phone] with questions or concerns regarding your invoice.   Our billing staff will not be able to assist you with questions regarding bills from these companies.  You will be contacted with the lab results as soon as they are available. The fastest way to get your results is to activate your My Chart account. Instructions are located on the last page of this paperwork. If you have not heard from [place] regarding the results in 2 weeks, please contact this office.

## 2016-12-25 ENCOUNTER — Ambulatory Visit (INDEPENDENT_AMBULATORY_CARE_PROVIDER_SITE_OTHER): Payer: BLUE CROSS/BLUE SHIELD | Admitting: Emergency Medicine

## 2016-12-25 VITALS — BP 124/80 | HR 78 | Temp 98.4°F | Resp 17 | Ht 68.5 in | Wt 169.0 lb

## 2016-12-25 DIAGNOSIS — N926 Irregular menstruation, unspecified: Secondary | ICD-10-CM | POA: Insufficient documentation

## 2016-12-25 LAB — POCT URINALYSIS DIP (MANUAL ENTRY)
BILIRUBIN UA: NEGATIVE
BILIRUBIN UA: NEGATIVE
Glucose, UA: NEGATIVE
Leukocytes, UA: NEGATIVE
Nitrite, UA: NEGATIVE
PH UA: 7 (ref 5.0–8.0)
PROTEIN UA: NEGATIVE
SPEC GRAV UA: 1.02 (ref 1.030–1.035)
Urobilinogen, UA: 0.2 (ref ?–2.0)

## 2016-12-25 LAB — POCT URINE PREGNANCY: Preg Test, Ur: NEGATIVE
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
## 2016-12-25 NOTE — Patient Instructions (Addendum)
IF you received an x-ray today, you will receive an invoice from [facility]. Please contact [facility] at [phone] with questions or concerns regarding your invoice.   IF you received labwork today, you will receive an invoice from [facility]. Please contact [facility] at [phone] with questions or concerns regarding your invoice.   Our billing staff will not be able to assist you with questions regarding bills from these companies.  You will be contacted with the lab results as soon as they are available. The fastest way to get your results is to activate your My Chart account. Instructions are located on the last page of this paperwork. If you have not heard from [place] regarding the results in 2 weeks, please contact this office.      Dysfunctional Uterine Bleeding Dysfunctional uterine bleeding is abnormal bleeding from the uterus. Dysfunctional uterine bleeding includes:  A period that comes earlier or later than usual.  A period that is lighter, heavier, or has blood clots.  Bleeding between periods.  Skipping one or more periods.  Bleeding after sexual intercourse.  Bleeding after menopause. Follow these instructions at home: Pay attention to any changes in your symptoms. Follow these instructions to help with your condition: Eating and drinking   Eat well-balanced meals. Include foods that are high in iron, such as liver, meat, shellfish, green leafy vegetables, and eggs.  If you become constipated:  Drink plenty of water.  Eat fruits and vegetables that are high in water and fiber, such as spinach, carrots, raspberries, apples, and mango. Medicines   Take over-the-counter and prescription medicines only as told by your health care provider.  Do not change medicines without talking with your health care provider.  Aspirin or medicines that contain aspirin may make the bleeding worse. Do not take those medicines:  During the week before  your period.  During your period.  If you were prescribed iron pills, take them as told by your health care provider. Iron pills help to replace iron that your body loses because of this condition. Activity   If you need to change your sanitary pad or tampon more than one time every 2 hours:  Lie in bed with your feet raised (elevated).  Place a cold pack on your lower abdomen.  Rest as much as possible until the bleeding stops or slows down.  Do not try to lose weight until the bleeding has stopped and your blood iron level is back to normal. Other Instructions   For two months, write down:  When your period starts.  When your period ends.  When any abnormal bleeding occurs.  What problems you notice.  Keep all follow up visits as told by your health care provider. This is important. Contact a health care provider if:  You get light-headed or weak.  You have nausea and vomiting.  You cannot eat or drink without vomiting.  You feel dizzy or have diarrhea while you are taking medicines.  You are taking birth control pills or hormones, and you want to change them or stop taking them. Get help right away if:  You develop a fever or chills.  You need to change your sanitary pad or tampon more than one time per hour.  Your bleeding becomes heavier, or your flow contains clots more often.  You develop pain in your abdomen.  You lose consciousness.  You develop a rash. This information is not intended to replace advice given to you by your health  care provider. Make sure you discuss any questions you have with your health care provider. Document Released: 09/20/2000 Document Revised: 02/29/2016 Document Reviewed: 12/19/2014 Elsevier Interactive Patient Education  2017 [employer]

## 2017-01-16 ENCOUNTER — Encounter: Payer: Self-pay | Admitting: Obstetrics & Gynecology

## 2017-02-20 ENCOUNTER — Encounter: Payer: BLUE CROSS/BLUE SHIELD | Admitting: Obstetrics & Gynecology

## 2017-03-25 DIAGNOSIS — Z3040 Encounter for surveillance of contraceptives, unspecified: Secondary | ICD-10-CM | POA: Diagnosis not present

## 2017-03-25 DIAGNOSIS — Z124 Encounter for screening for malignant neoplasm of cervix: Secondary | ICD-10-CM | POA: Diagnosis not present

## 2017-03-25 DIAGNOSIS — Z113 Encounter for screening for infections with a predominantly sexual mode of transmission: Secondary | ICD-10-CM | POA: Diagnosis not present

## 2017-03-25 DIAGNOSIS — R87610 Atypical squamous cells of undetermined significance on cytologic smear of cervix (ASC-US): Secondary | ICD-10-CM | POA: Diagnosis not present

## 2017-03-25 DIAGNOSIS — Z01419 Encounter for gynecological examination (general) (routine) without abnormal findings: Secondary | ICD-10-CM | POA: Diagnosis not present

## 2017-04-30 DIAGNOSIS — R300 Dysuria: Secondary | ICD-10-CM | POA: Diagnosis not present

## 2017-04-30 DIAGNOSIS — A749 Chlamydial infection, unspecified: Secondary | ICD-10-CM | POA: Diagnosis not present

## 2017-05-19 DIAGNOSIS — S50862A Insect bite (nonvenomous) of left forearm, initial encounter: Secondary | ICD-10-CM | POA: Diagnosis not present

## 2017-07-09 ENCOUNTER — Encounter: Payer: Self-pay | Admitting: Emergency Medicine

## 2017-07-09 ENCOUNTER — Ambulatory Visit (INDEPENDENT_AMBULATORY_CARE_PROVIDER_SITE_OTHER): Payer: BLUE CROSS/BLUE SHIELD | Admitting: Emergency Medicine

## 2017-07-09 VITALS — BP 100/76 | HR 75 | Temp 98.5°F | Resp 16 | Ht 67.0 in | Wt 175.6 lb

## 2017-07-09 DIAGNOSIS — R0781 Pleurodynia: Secondary | ICD-10-CM | POA: Diagnosis not present

## 2017-07-09 DIAGNOSIS — R091 Pleurisy: Secondary | ICD-10-CM | POA: Insufficient documentation

## 2017-07-09 DIAGNOSIS — B349 Viral infection, unspecified: Secondary | ICD-10-CM

## 2017-07-09 DIAGNOSIS — J029 Acute pharyngitis, unspecified: Secondary | ICD-10-CM | POA: Diagnosis not present
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
## 2017-07-09 NOTE — Patient Instructions (Addendum)
     IF you received an x-ray today, you will receive an invoice from [facility]. Please contact [facility] at [phone] with questions or concerns regarding your invoice.   IF you received labwork today, you will receive an invoice from [facility]. Please contact [facility] at [phone] with questions or concerns regarding your invoice.   Our billing staff will not be able to assist you with questions regarding bills from these companies.  You will be contacted with the lab results as soon as they are available. The fastest way to get your results is to activate your My Chart account. Instructions are located on the last page of this paperwork. If you have not heard from us regarding the results in 2 weeks, please contact this office.      Pleurisy Pleurisy is irritation and swelling (inflammation) of the linings of your lungs (pleura). This can cause pain in your chest, back, or shoulder. It can also cause trouble breathing. Follow these instructions at home: Medicines  Take over-the-counter and prescription medicines only as told by your doctor.  If you were prescribed antibiotic medicine, take it as told by your doctor. Do not stop taking the antibiotic even if you start to feel better. Activity  Rest and return to your normal activities as told by your doctor. Ask your doctor what activities are safe for you.  Do not drive or use heavy machinery while taking prescription pain medicine. General instructions  Watch for any changes in your condition.  Take deep breaths often, even if it is painful. This can help prevent lung problems.  When lying down, lie on your painful side. This may help you feel less pain.  Do not smoke. If you need help quitting, ask your doctor.  Keep all follow-up visits as told by your doctor. This is important. Contact a doctor if:  You have pain that:  Gets worse.  Does not get better with medicine.  Lasts for more than  1 week.  You have a fever or chills.  You have a cough that does not get better at home.  You have trouble breathing that does not get better at home.  You cough up liquid that looks like pus (purulent secretions). Get help right away if:  Your lips, fingernails, or toenails turn dark or turn blue.  You cough up blood.  You have trouble breathing that gets worse.  You are making loud noises when you breathe (wheezing) and this gets worse.  You have pain that spreads to your neck, arms, or jaw.  You get a rash.  You throw up (vomit).  You pass out (faint). Summary  Pleurisy is irritation and swelling (inflammation) of the linings of your lungs (pleura).  Pleurisy can cause pain and trouble breathing.  If you have a cough that does not get better at home, contact your doctor.  Get help right away if you are having trouble breathing and it is getting worse. This information is not intended to replace advice given to you by your health care provider. Make sure you discuss any questions you have with your health care provider. Document Released: 09/05/2008 Document Revised: 06/17/2016 Document Reviewed: 06/17/2016 Elsevier Interactive Patient Education  2017 Elsevier Inc.  
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
## 2017-08-09 ENCOUNTER — Encounter (HOSPITAL_COMMUNITY): Payer: Self-pay | Admitting: *Deleted

## 2017-08-09 ENCOUNTER — Inpatient Hospital Stay (HOSPITAL_COMMUNITY)
Admission: AD | Admit: 2017-08-09 | Discharge: 2017-08-10 | Disposition: A | Discharge status: Home / Self Care | Payer: BLUE CROSS/BLUE SHIELD | Source: Ambulatory Visit | Attending: Obstetrics and Gynecology | Admitting: Obstetrics and Gynecology

## 2017-08-09 DIAGNOSIS — O210 Mild hyperemesis gravidarum: Secondary | ICD-10-CM | POA: Insufficient documentation

## 2017-08-09 DIAGNOSIS — O26891 Other specified pregnancy related conditions, first trimester: Secondary | ICD-10-CM | POA: Diagnosis not present

## 2017-08-09 DIAGNOSIS — R109 Unspecified abdominal pain: Secondary | ICD-10-CM | POA: Insufficient documentation

## 2017-08-09 DIAGNOSIS — Z3A01 Less than 8 weeks gestation of pregnancy: Secondary | ICD-10-CM | POA: Insufficient documentation

## 2017-08-09 DIAGNOSIS — O219 Vomiting of pregnancy, unspecified: Secondary | ICD-10-CM | POA: Diagnosis not present

## 2017-08-09 DIAGNOSIS — O26899 Other specified pregnancy related conditions, unspecified trimester: Secondary | ICD-10-CM

## 2017-08-09 LAB — POCT PREGNANCY, URINE: PREG TEST UR: POSITIVE — AB
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
## 2017-08-09 NOTE — MAU Note (Signed)
Having some abd cramping and period late. Just wanting to make sure I am pregnant. Pos home upt.

## 2017-08-10 ENCOUNTER — Inpatient Hospital Stay (HOSPITAL_COMMUNITY): Payer: BLUE CROSS/BLUE SHIELD

## 2017-08-10 ENCOUNTER — Encounter (HOSPITAL_COMMUNITY): Payer: Self-pay | Admitting: *Deleted

## 2017-08-10 DIAGNOSIS — O219 Vomiting of pregnancy, unspecified: Secondary | ICD-10-CM

## 2017-08-10 DIAGNOSIS — O26891 Other specified pregnancy related conditions, first trimester: Secondary | ICD-10-CM | POA: Diagnosis not present

## 2017-08-10 LAB — CBC
HCT: 34.1 % — ABNORMAL LOW (ref 36.0–46.0)
Hemoglobin: 11.9 g/dL — ABNORMAL LOW (ref 12.0–15.0)
MCH: 30.4 pg (ref 26.0–34.0)
MCHC: 34.9 g/dL (ref 30.0–36.0)
MCV: 87 fL (ref 78.0–100.0)
PLATELETS: 210 10*3/uL (ref 150–400)
RBC: 3.92 MIL/uL (ref 3.87–5.11)
RDW: 12.6 % (ref 11.5–15.5)
WBC: 8.8 10*3/uL (ref 4.0–10.5)

## 2017-08-10 LAB — HIV ANTIBODY (ROUTINE TESTING W REFLEX): HIV SCREEN 4TH GENERATION: NONREACTIVE

## 2017-08-10 LAB — URINALYSIS, ROUTINE W REFLEX MICROSCOPIC
Bilirubin Urine: NEGATIVE
GLUCOSE, UA: NEGATIVE mg/dL
Hgb urine dipstick: NEGATIVE
Ketones, ur: 20 mg/dL — AB
LEUKOCYTES UA: NEGATIVE
Nitrite: NEGATIVE
PH: 5 (ref 5.0–8.0)
PROTEIN: NEGATIVE mg/dL
Specific Gravity, Urine: 1.026 (ref 1.005–1.030)

## 2017-08-10 LAB — ABO/RH: ABO/RH(D): O POS

## 2017-08-10 LAB — HCG, QUANTITATIVE, PREGNANCY: HCG, BETA CHAIN, QUANT, S: 5366 m[IU]/mL — AB (ref <5)

## 2017-08-10 NOTE — Discharge Instructions (Signed)

## 2017-08-10 NOTE — Progress Notes (Signed)
Jennifer Rasch NP in to discuss test results and d/c plan with pt. Written and verbal d/c instructions given and understanding voiced 

## 2017-08-10 NOTE — Progress Notes (Signed)
J Rasch NP notified of pt's u/s and lab results. Will come discuss results and d/c plan with pt

## 2017-09-04 DIAGNOSIS — Z113 Encounter for screening for infections with a predominantly sexual mode of transmission: Secondary | ICD-10-CM | POA: Diagnosis not present

## 2017-09-04 DIAGNOSIS — Z3491 Encounter for supervision of normal pregnancy, unspecified, first trimester: Secondary | ICD-10-CM | POA: Diagnosis not present

## 2017-09-04 LAB — OB RESULTS CONSOLE GC/CHLAMYDIA
CHLAMYDIA, DNA PROBE: NEGATIVE
GC PROBE AMP, GENITAL: NEGATIVE

## 2017-09-04 LAB — OB RESULTS CONSOLE RUBELLA ANTIBODY, IGM: RUBELLA: NON-IMMUNE/NOT IMMUNE

## 2017-09-04 LAB — OB RESULTS CONSOLE ABO/RH: RH Type: POSITIVE

## 2017-09-04 LAB — OB RESULTS CONSOLE ANTIBODY SCREEN: ANTIBODY SCREEN: NEGATIVE

## 2017-09-04 LAB — OB RESULTS CONSOLE RPR: RPR: NONREACTIVE

## 2017-09-04 LAB — OB RESULTS CONSOLE HEPATITIS B SURFACE ANTIGEN: HEP B S AG: NEGATIVE

## 2017-10-07 NOTE — L&D Delivery Note (Signed)
Delivery Note At  a viable female was delivered via  (Presentation: direct OP).  APGAR: 9, 9; weighthe t pending.   Placenta status: delivered spontaneously and completely.  Cord:  Three vessel no complications: .  Cord   Anesthesia:  Epidural Episiotomy:  None Lacerations:  1st degree perineal with right labial extension Suture Repair: 3.0 vicryl rapide Est. Blood Loss (mL):  460  Mom to postpartum.  Baby to Couplet care / Skin to Skin.  Janeece RiggersEllis K Guillermo Difrancesco 04/12/2018, 10:08 AM

## 2017-10-08 DIAGNOSIS — Z3A13 13 weeks gestation of pregnancy: Secondary | ICD-10-CM | POA: Diagnosis not present

## 2017-10-08 DIAGNOSIS — O3680X0 Pregnancy with inconclusive fetal viability, not applicable or unspecified: Secondary | ICD-10-CM | POA: Diagnosis not present

## 2017-10-08 DIAGNOSIS — O3680X1 Pregnancy with inconclusive fetal viability, fetus 1: Secondary | ICD-10-CM | POA: Diagnosis not present

## 2017-10-08 DIAGNOSIS — Z3401 Encounter for supervision of normal first pregnancy, first trimester: Secondary | ICD-10-CM | POA: Diagnosis not present

## 2017-10-25 ENCOUNTER — Encounter (HOSPITAL_COMMUNITY): Payer: Self-pay | Admitting: *Deleted

## 2017-10-25 ENCOUNTER — Inpatient Hospital Stay (HOSPITAL_COMMUNITY)
Admission: AD | Admit: 2017-10-25 | Discharge: 2017-10-25 | Disposition: A | Discharge status: Home / Self Care | Payer: BLUE CROSS/BLUE SHIELD | Source: Ambulatory Visit | Attending: Obstetrics & Gynecology | Admitting: Obstetrics & Gynecology

## 2017-10-25 DIAGNOSIS — Z3A16 16 weeks gestation of pregnancy: Secondary | ICD-10-CM | POA: Diagnosis not present

## 2017-10-25 DIAGNOSIS — O26892 Other specified pregnancy related conditions, second trimester: Secondary | ICD-10-CM | POA: Diagnosis not present

## 2017-10-25 DIAGNOSIS — R109 Unspecified abdominal pain: Secondary | ICD-10-CM | POA: Insufficient documentation

## 2017-10-25 DIAGNOSIS — O26893 Other specified pregnancy related conditions, third trimester: Secondary | ICD-10-CM | POA: Insufficient documentation

## 2017-10-25 DIAGNOSIS — J029 Acute pharyngitis, unspecified: Secondary | ICD-10-CM | POA: Insufficient documentation

## 2017-10-25 LAB — WET PREP, GENITAL
CLUE CELLS WET PREP: NONE SEEN
Sperm: NONE SEEN
Trich, Wet Prep: NONE SEEN
YEAST WET PREP: NONE SEEN

## 2017-10-25 LAB — URINALYSIS, ROUTINE W REFLEX MICROSCOPIC
Bilirubin Urine: NEGATIVE
GLUCOSE, UA: NEGATIVE mg/dL
HGB URINE DIPSTICK: NEGATIVE
Ketones, ur: NEGATIVE mg/dL
Leukocytes, UA: NEGATIVE
Nitrite: NEGATIVE
Protein, ur: NEGATIVE mg/dL
SPECIFIC GRAVITY, URINE: 1.016 (ref 1.005–1.030)
pH: 6 (ref 5.0–8.0)
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
## 2017-10-25 NOTE — Discharge Instructions (Signed)
Drink at least 8 8-oz glasses of water every day. Take Tylenol 325 mg 2 tablets by mouth every 4 hours if needed for pain. Use your list of medications that are safe in pregnancy to treat your sore throat. If you pain is worsening or if you are having vaginal bleeding, call the office and be seen. Keep all of your appointments in the office.

## 2017-10-25 NOTE — MAU Note (Signed)
Pt reports she has had several sharp pain and cramping that come out of no where off and on since yesterday. Denies any vaginal bleeding or discharge.  Also c/o sore throat and cough since yesterday as well. Reports her vomitng has come back since she started with sore throat.

## 2017-10-27 LAB — GC/CHLAMYDIA PROBE AMP (~~LOC~~) NOT AT ARMC
Chlamydia: NEGATIVE
NEISSERIA GONORRHEA: NEGATIVE

## 2017-11-04 DIAGNOSIS — Z3A17 17 weeks gestation of pregnancy: Secondary | ICD-10-CM | POA: Diagnosis not present

## 2017-11-04 DIAGNOSIS — Z369 Encounter for antenatal screening, unspecified: Secondary | ICD-10-CM | POA: Diagnosis not present

## 2017-11-04 DIAGNOSIS — Z3492 Encounter for supervision of normal pregnancy, unspecified, second trimester: Secondary | ICD-10-CM | POA: Diagnosis not present

## 2017-11-04 DIAGNOSIS — N898 Other specified noninflammatory disorders of vagina: Secondary | ICD-10-CM | POA: Diagnosis not present

## 2017-11-24 DIAGNOSIS — Z363 Encounter for antenatal screening for malformations: Secondary | ICD-10-CM | POA: Diagnosis not present

## 2017-11-24 DIAGNOSIS — Z3492 Encounter for supervision of normal pregnancy, unspecified, second trimester: Secondary | ICD-10-CM | POA: Diagnosis not present

## 2017-11-24 DIAGNOSIS — Z3A20 20 weeks gestation of pregnancy: Secondary | ICD-10-CM | POA: Diagnosis not present

## 2017-12-14 ENCOUNTER — Encounter (HOSPITAL_COMMUNITY): Payer: Self-pay

## 2017-12-14 ENCOUNTER — Inpatient Hospital Stay (HOSPITAL_COMMUNITY)
Admission: AD | Admit: 2017-12-14 | Discharge: 2017-12-14 | Disposition: A | Discharge status: Home / Self Care | Payer: BLUE CROSS/BLUE SHIELD | Source: Ambulatory Visit | Attending: Obstetrics and Gynecology | Admitting: Obstetrics and Gynecology

## 2017-12-14 DIAGNOSIS — R109 Unspecified abdominal pain: Secondary | ICD-10-CM | POA: Insufficient documentation

## 2017-12-14 DIAGNOSIS — B3731 Acute candidiasis of vulva and vagina: Secondary | ICD-10-CM

## 2017-12-14 DIAGNOSIS — O26892 Other specified pregnancy related conditions, second trimester: Secondary | ICD-10-CM | POA: Diagnosis not present

## 2017-12-14 DIAGNOSIS — Z3A23 23 weeks gestation of pregnancy: Secondary | ICD-10-CM | POA: Insufficient documentation

## 2017-12-14 DIAGNOSIS — B379 Candidiasis, unspecified: Secondary | ICD-10-CM | POA: Diagnosis not present

## 2017-12-14 DIAGNOSIS — N898 Other specified noninflammatory disorders of vagina: Secondary | ICD-10-CM | POA: Insufficient documentation

## 2017-12-14 DIAGNOSIS — B373 Candidiasis of vulva and vagina: Secondary | ICD-10-CM

## 2017-12-14 LAB — URINALYSIS, ROUTINE W REFLEX MICROSCOPIC
BILIRUBIN URINE: NEGATIVE
Glucose, UA: NEGATIVE mg/dL
HGB URINE DIPSTICK: NEGATIVE
Ketones, ur: NEGATIVE mg/dL
Leukocytes, UA: NEGATIVE
Nitrite: NEGATIVE
PROTEIN: NEGATIVE mg/dL
Specific Gravity, Urine: 1.021 (ref 1.005–1.030)
pH: 7 (ref 5.0–8.0)

## 2017-12-14 LAB — WET PREP, GENITAL
Clue Cells Wet Prep HPF POC: NONE SEEN
SPERM: NONE SEEN
Trich, Wet Prep: NONE SEEN
YEAST WET PREP: NONE SEEN

## 2017-12-14 LAB — FETAL FIBRONECTIN: Fetal Fibronectin: NEGATIVE

## 2017-12-14 MED ORDER — FLUCONAZOLE 150 MG PO TABS: ORAL_TABLET | ORAL | 0 refills | Status: DC

## 2017-12-14 NOTE — MAU Note (Signed)
Pt reports lower abdominal cramping that started while she was at work at 8pm. Pt states she sat down and the pain got a little better. Pain is worse with walking and moving, Pt denies vaginal bleeding or LOF. Reports increase in discharge. Denies itching, odor or irritation. Pt reports fetal movement.
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
## 2017-12-23 DIAGNOSIS — Z3A24 24 weeks gestation of pregnancy: Secondary | ICD-10-CM | POA: Diagnosis not present

## 2017-12-23 DIAGNOSIS — Z3492 Encounter for supervision of normal pregnancy, unspecified, second trimester: Secondary | ICD-10-CM | POA: Diagnosis not present

## 2018-01-20 DIAGNOSIS — Z369 Encounter for antenatal screening, unspecified: Secondary | ICD-10-CM | POA: Diagnosis not present

## 2018-01-20 DIAGNOSIS — Z23 Encounter for immunization: Secondary | ICD-10-CM | POA: Diagnosis not present

## 2018-02-03 DIAGNOSIS — Z3A30 30 weeks gestation of pregnancy: Secondary | ICD-10-CM | POA: Diagnosis not present

## 2018-02-03 DIAGNOSIS — O26899 Other specified pregnancy related conditions, unspecified trimester: Secondary | ICD-10-CM | POA: Diagnosis not present

## 2018-02-03 DIAGNOSIS — R102 Pelvic and perineal pain: Secondary | ICD-10-CM | POA: Diagnosis not present

## 2018-02-18 DIAGNOSIS — Z3A32 32 weeks gestation of pregnancy: Secondary | ICD-10-CM | POA: Diagnosis not present

## 2018-02-18 DIAGNOSIS — R52 Pain, unspecified: Secondary | ICD-10-CM | POA: Diagnosis not present

## 2018-02-18 DIAGNOSIS — O368199 Decreased fetal movements, unspecified trimester, other fetus: Secondary | ICD-10-CM | POA: Diagnosis not present

## 2018-02-20 ENCOUNTER — Encounter: Payer: Self-pay | Admitting: Family Medicine

## 2018-02-25 ENCOUNTER — Encounter: Payer: Self-pay | Admitting: Family Medicine

## 2018-03-05 DIAGNOSIS — N898 Other specified noninflammatory disorders of vagina: Secondary | ICD-10-CM | POA: Diagnosis not present

## 2018-03-16 DIAGNOSIS — Z113 Encounter for screening for infections with a predominantly sexual mode of transmission: Secondary | ICD-10-CM | POA: Diagnosis not present

## 2018-03-20 ENCOUNTER — Inpatient Hospital Stay (HOSPITAL_COMMUNITY)
Admission: AD | Admit: 2018-03-20 | Discharge: 2018-03-20 | Disposition: A | Discharge status: Home / Self Care | Payer: BLUE CROSS/BLUE SHIELD | Source: Ambulatory Visit | Attending: Obstetrics & Gynecology | Admitting: Obstetrics & Gynecology

## 2018-03-20 ENCOUNTER — Encounter (HOSPITAL_COMMUNITY): Payer: Self-pay

## 2018-03-20 ENCOUNTER — Other Ambulatory Visit: Payer: Self-pay

## 2018-03-20 DIAGNOSIS — Z3A37 37 weeks gestation of pregnancy: Secondary | ICD-10-CM | POA: Insufficient documentation

## 2018-03-20 DIAGNOSIS — R51 Headache: Secondary | ICD-10-CM | POA: Insufficient documentation

## 2018-03-20 DIAGNOSIS — O26893 Other specified pregnancy related conditions, third trimester: Secondary | ICD-10-CM | POA: Diagnosis not present

## 2018-03-20 DIAGNOSIS — D508 Other iron deficiency anemias: Secondary | ICD-10-CM | POA: Diagnosis not present

## 2018-03-20 DIAGNOSIS — O99013 Anemia complicating pregnancy, third trimester: Secondary | ICD-10-CM | POA: Insufficient documentation

## 2018-03-20 DIAGNOSIS — R42 Dizziness and giddiness: Secondary | ICD-10-CM | POA: Diagnosis not present

## 2018-03-20 DIAGNOSIS — E639 Nutritional deficiency, unspecified: Secondary | ICD-10-CM | POA: Diagnosis not present

## 2018-03-20 DIAGNOSIS — F1221 Cannabis dependence, in remission: Secondary | ICD-10-CM | POA: Diagnosis not present

## 2018-03-20 LAB — COMPREHENSIVE METABOLIC PANEL
ALBUMIN: 3 g/dL — AB (ref 3.5–5.0)
ALT: 13 U/L — AB (ref 14–54)
AST: 21 U/L (ref 15–41)
Alkaline Phosphatase: 103 U/L (ref 38–126)
Anion gap: 8 (ref 5–15)
BUN: 7 mg/dL (ref 6–20)
CO2: 22 mmol/L (ref 22–32)
CREATININE: 0.63 mg/dL (ref 0.44–1.00)
Calcium: 9 mg/dL (ref 8.9–10.3)
Chloride: 107 mmol/L (ref 101–111)
GFR calc Af Amer: >60 mL/min (ref >60)
GFR calc non Af Amer: >60 mL/min (ref >60)
GLUCOSE: 86 mg/dL (ref 65–99)
Potassium: 3.9 mmol/L (ref 3.5–5.1)
SODIUM: 137 mmol/L (ref 135–145)
Total Bilirubin: 0.1 mg/dL — ABNORMAL LOW (ref 0.3–1.2)
Total Protein: 6.5 g/dL (ref 6.5–8.1)

## 2018-03-20 LAB — CBC WITH DIFFERENTIAL/PLATELET
BASOS PCT: 0 %
Basophils Absolute: 0 10*3/uL (ref 0.0–0.1)
EOS ABS: 0.3 10*3/uL (ref 0.0–0.7)
Eosinophils Relative: 3 %
HCT: 30.1 % — ABNORMAL LOW (ref 36.0–46.0)
HEMOGLOBIN: 10.1 g/dL — AB (ref 12.0–15.0)
LYMPHS ABS: 2.2 10*3/uL (ref 0.7–4.0)
Lymphocytes Relative: 20 %
MCH: 30.3 pg (ref 26.0–34.0)
MCHC: 33.6 g/dL (ref 30.0–36.0)
MCV: 90.4 fL (ref 78.0–100.0)
Monocytes Absolute: 0.5 10*3/uL (ref 0.1–1.0)
Monocytes Relative: 4 %
NEUTROS ABS: 8.3 10*3/uL — AB (ref 1.7–7.7)
NEUTROS PCT: 73 %
Platelets: 187 10*3/uL (ref 150–400)
RBC: 3.33 MIL/uL — AB (ref 3.87–5.11)
RDW: 13 % (ref 11.5–15.5)
WBC: 11.3 10*3/uL — AB (ref 4.0–10.5)

## 2018-03-20 LAB — URINALYSIS, ROUTINE W REFLEX MICROSCOPIC
BILIRUBIN URINE: NEGATIVE
Glucose, UA: NEGATIVE mg/dL
Hgb urine dipstick: NEGATIVE
Ketones, ur: NEGATIVE mg/dL
LEUKOCYTES UA: NEGATIVE
NITRITE: NEGATIVE
Protein, ur: NEGATIVE mg/dL
SPECIFIC GRAVITY, URINE: 1.01 (ref 1.005–1.030)
pH: 6 (ref 5.0–8.0)
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
## 2018-03-20 NOTE — Discharge Instructions (Signed)
Anemia Anemia is a condition in which you do not have enough red blood cells or hemoglobin. Hemoglobin is a substance in red blood cells that carries oxygen. When you do not have enough red blood cells or hemoglobin (are anemic), your body cannot get enough oxygen and your organs may not work properly. As a result, you may feel very tired or have other problems. What are the causes? Common causes of anemia include:  Excessive bleeding. Anemia can be caused by excessive bleeding inside or outside the body, including bleeding from the intestine or from periods in women.  Poor nutrition.  Long-lasting (chronic) kidney, thyroid, and liver disease.  Bone marrow disorders.  Cancer and treatments for cancer.  HIV (human immunodeficiency virus) and AIDS (acquired immunodeficiency syndrome).  Treatments for HIV and AIDS.  Spleen problems.  Blood disorders.  Infections, medicines, and autoimmune disorders that destroy red blood cells.  What are the signs or symptoms? Symptoms of this condition include:  Minor weakness.  Dizziness.  Headache.  Feeling heartbeats that are irregular or faster than normal (palpitations).  Shortness of breath, especially with exercise.  Paleness.  Cold sensitivity.  Indigestion.  Nausea.  Difficulty sleeping.  Difficulty concentrating.  Symptoms may occur suddenly or develop slowly. If your anemia is mild, you may not have symptoms. How is this diagnosed? This condition is diagnosed based on:  Blood tests.  Your medical history.  A physical exam.  Bone marrow biopsy.  Your health care provider may also check your stool (feces) for blood and may do additional testing to look for the cause of your bleeding. You may also have other tests, including:  Imaging tests, such as a CT scan or MRI.  Endoscopy.  Colonoscopy.  How is this treated? Treatment for this condition depends on the cause. If you continue to lose a lot of blood,  you may need to be treated at a hospital. Treatment may include:  Taking supplements of iron, vitamin T02, or folic acid.  Taking a hormone medicine (erythropoietin) that can help to stimulate red blood cell growth.  Having a blood transfusion. This may be needed if you lose a lot of blood.  Making changes to your diet.  Having surgery to remove your spleen.  Follow these instructions at home:  Take over-the-counter and prescription medicines only as told by your health care provider.  Take supplements only as told by your health care provider.  Follow any diet instructions that you were given.  Keep all follow-up visits as told by your health care provider. This is important. Contact a health care provider if:  You develop new bleeding anywhere in the body. Get help right away if:  You are very weak.  You are short of breath.  You have pain in your abdomen or chest.  You are dizzy or feel faint.  You have trouble concentrating.  You have bloody or black, tarry stools.  You vomit repeatedly or you vomit up blood. Summary  Anemia is a condition in which you do not have enough red blood cells or enough of a substance in your red blood cells that carries oxygen (hemoglobin).  Symptoms may occur suddenly or develop slowly.  If your anemia is mild, you may not have symptoms.  This condition is diagnosed with blood tests as well as a medical history and physical exam. Other tests may be needed.  Treatment for this condition depends on the cause of the anemia. This information is not intended to replace advice  given to you by your health care provider. Make sure you discuss any questions you have with your health care provider. °Document Released: 10/31/2004 Document Revised: 10/25/2016 Document Reviewed: 10/25/2016 °Elsevier Interactive Patient Education © 2018 [employer] Inc[employer] ° °

## 2018-03-20 NOTE — MAU Note (Signed)
Pt presents with c/o dizziness & light headedness that began 2 days ago, but has worsened.  Reports currently taking Iron supplement for low iron.   Denies LOF or VB.  Reports +FM.

## 2018-03-25 DIAGNOSIS — Z3A37 37 weeks gestation of pregnancy: Secondary | ICD-10-CM | POA: Diagnosis not present

## 2018-03-25 DIAGNOSIS — Z3493 Encounter for supervision of normal pregnancy, unspecified, third trimester: Secondary | ICD-10-CM | POA: Diagnosis not present

## 2018-04-01 DIAGNOSIS — Z3A38 38 weeks gestation of pregnancy: Secondary | ICD-10-CM | POA: Diagnosis not present

## 2018-04-01 DIAGNOSIS — Z3403 Encounter for supervision of normal first pregnancy, third trimester: Secondary | ICD-10-CM | POA: Diagnosis not present

## 2018-04-08 DIAGNOSIS — Z3A35 35 weeks gestation of pregnancy: Secondary | ICD-10-CM | POA: Diagnosis not present

## 2018-04-08 DIAGNOSIS — Z3493 Encounter for supervision of normal pregnancy, unspecified, third trimester: Secondary | ICD-10-CM | POA: Diagnosis not present

## 2018-04-11 ENCOUNTER — Inpatient Hospital Stay (HOSPITAL_COMMUNITY)
Admission: AD | Admit: 2018-04-11 | Discharge: 2018-04-14 | DRG: 807 | Disposition: A | Discharge status: Home / Self Care | Payer: BLUE CROSS/BLUE SHIELD | Attending: Obstetrics and Gynecology | Admitting: Obstetrics and Gynecology

## 2018-04-11 ENCOUNTER — Encounter (HOSPITAL_COMMUNITY): Payer: Self-pay

## 2018-04-11 ENCOUNTER — Other Ambulatory Visit: Payer: Self-pay

## 2018-04-11 DIAGNOSIS — Z3A30 30 weeks gestation of pregnancy: Secondary | ICD-10-CM

## 2018-04-11 DIAGNOSIS — Z3493 Encounter for supervision of normal pregnancy, unspecified, third trimester: Secondary | ICD-10-CM

## 2018-04-11 DIAGNOSIS — O4290 Premature rupture of membranes, unspecified as to length of time between rupture and onset of labor, unspecified weeks of gestation: Secondary | ICD-10-CM | POA: Diagnosis present

## 2018-04-11 DIAGNOSIS — O164 Unspecified maternal hypertension, complicating childbirth: Secondary | ICD-10-CM | POA: Diagnosis not present

## 2018-04-11 DIAGNOSIS — O99824 Streptococcus B carrier state complicating childbirth: Secondary | ICD-10-CM | POA: Diagnosis present

## 2018-04-11 DIAGNOSIS — D649 Anemia, unspecified: Secondary | ICD-10-CM | POA: Diagnosis not present

## 2018-04-11 DIAGNOSIS — O9902 Anemia complicating childbirth: Secondary | ICD-10-CM | POA: Diagnosis not present

## 2018-04-11 DIAGNOSIS — Z3A40 40 weeks gestation of pregnancy: Secondary | ICD-10-CM | POA: Diagnosis not present

## 2018-04-11 DIAGNOSIS — O4292 Full-term premature rupture of membranes, unspecified as to length of time between rupture and onset of labor: Secondary | ICD-10-CM | POA: Diagnosis not present

## 2018-04-11 LAB — COMPREHENSIVE METABOLIC PANEL
ALT: 12 U/L (ref 0–44)
AST: 24 U/L (ref 15–41)
Albumin: 3.1 g/dL — ABNORMAL LOW (ref 3.5–5.0)
Alkaline Phosphatase: 145 U/L — ABNORMAL HIGH (ref 38–126)
Anion gap: 10 (ref 5–15)
BUN: 8 mg/dL (ref 6–20)
CHLORIDE: 104 mmol/L (ref 98–111)
CO2: 19 mmol/L — ABNORMAL LOW (ref 22–32)
CREATININE: 0.62 mg/dL (ref 0.44–1.00)
Calcium: 8.9 mg/dL (ref 8.9–10.3)
GFR calc Af Amer: >60 mL/min (ref >60)
Glucose, Bld: 87 mg/dL (ref 70–99)
Potassium: 3.6 mmol/L (ref 3.5–5.1)
Sodium: 133 mmol/L — ABNORMAL LOW (ref 135–145)
Total Bilirubin: 0.1 mg/dL — ABNORMAL LOW (ref 0.3–1.2)
Total Protein: 7 g/dL (ref 6.5–8.1)

## 2018-04-11 LAB — POCT FERN TEST: POCT Fern Test: POSITIVE

## 2018-04-11 LAB — CBC
HEMATOCRIT: 33.9 % — AB (ref 36.0–46.0)
Hemoglobin: 11.2 g/dL — ABNORMAL LOW (ref 12.0–15.0)
MCH: 29.9 pg (ref 26.0–34.0)
MCHC: 33 g/dL (ref 30.0–36.0)
MCV: 90.6 fL (ref 78.0–100.0)
PLATELETS: 208 10*3/uL (ref 150–400)
RBC: 3.74 MIL/uL — ABNORMAL LOW (ref 3.87–5.11)
RDW: 13.4 % (ref 11.5–15.5)
WBC: 12.1 10*3/uL — AB (ref 4.0–10.5)

## 2018-04-11 LAB — PROTEIN / CREATININE RATIO, URINE
Creatinine, Urine: 146 mg/dL
Protein Creatinine Ratio: 0.1 mg/mg{Cre} (ref 0.00–0.15)
Total Protein, Urine: 15 mg/dL

## 2018-04-11 LAB — TYPE AND SCREEN
ABO/RH(D): O POS
Antibody Screen: NEGATIVE

## 2018-04-11 LAB — URIC ACID: URIC ACID, SERUM: 6.4 mg/dL (ref 2.5–7.1)

## 2018-04-11 LAB — OB RESULTS CONSOLE GBS: GBS: POSITIVE

## 2018-04-11 MED ORDER — OXYCODONE-ACETAMINOPHEN 5-325 MG PO TABS: 1.0000 | ORAL_TABLET | ORAL | Status: DC | PRN

## 2018-04-11 MED ORDER — LACTATED RINGERS IV SOLN
INTRAVENOUS | Status: DC
  Administered 2018-04-11 – 2018-04-12 (×2): via INTRAVENOUS

## 2018-04-11 MED ORDER — OXYTOCIN BOLUS FROM INFUSION
500.0000 mL | Freq: Once | INTRAVENOUS | Status: AC
  Administered 2018-04-12: 500 mL via INTRAVENOUS

## 2018-04-11 MED ORDER — SODIUM CHLORIDE 0.9 % IV SOLN
5.0000 10*6.[IU] | Freq: Once | INTRAVENOUS | Status: AC
  Administered 2018-04-11: 5 10*6.[IU] via INTRAVENOUS
  Filled 2018-04-11: qty 5

## 2018-04-11 MED ORDER — LIDOCAINE HCL (PF) 1 % IJ SOLN
30.0000 mL | INTRAMUSCULAR | Status: DC | PRN
  Filled 2018-04-11: qty 30

## 2018-04-11 MED ORDER — LACTATED RINGERS IV SOLN
500.0000 mL | INTRAVENOUS | Status: DC | PRN
  Administered 2018-04-12: 500 mL via INTRAVENOUS

## 2018-04-11 MED ORDER — FENTANYL CITRATE (PF) 100 MCG/2ML IJ SOLN: 50.0000 ug | INTRAMUSCULAR | Status: DC | PRN

## 2018-04-11 MED ORDER — PENICILLIN G POT IN DEXTROSE 60000 UNIT/ML IV SOLN
3.0000 10*6.[IU] | INTRAVENOUS | Status: DC
  Administered 2018-04-11 – 2018-04-12 (×3): 3 10*6.[IU] via INTRAVENOUS
  Filled 2018-04-11 (×8): qty 50

## 2018-04-11 MED ORDER — OXYTOCIN 40 UNITS IN LACTATED RINGERS INFUSION - SIMPLE MED
2.5000 [IU]/h | INTRAVENOUS | Status: DC
  Filled 2018-04-11: qty 1000

## 2018-04-11 MED ORDER — LACTATED RINGERS IV SOLN
INTRAVENOUS | Status: DC
  Administered 2018-04-12: 05:00:00 via INTRAVENOUS

## 2018-04-11 MED ORDER — ONDANSETRON HCL 4 MG/2ML IJ SOLN
4.0000 mg | Freq: Four times a day (QID) | INTRAMUSCULAR | Status: DC | PRN
  Administered 2018-04-12: 4 mg via INTRAVENOUS
  Filled 2018-04-11: qty 2

## 2018-04-11 MED ORDER — OXYCODONE-ACETAMINOPHEN 5-325 MG PO TABS: 2.0000 | ORAL_TABLET | ORAL | Status: DC | PRN

## 2018-04-11 MED ORDER — OXYTOCIN 40 UNITS IN LACTATED RINGERS INFUSION - SIMPLE MED
1.0000 m[IU]/min | INTRAVENOUS | Status: DC
  Administered 2018-04-11: 2 m[IU]/min via INTRAVENOUS

## 2018-04-11 MED ORDER — ACETAMINOPHEN 325 MG PO TABS: 650.0000 mg | ORAL_TABLET | ORAL | Status: DC | PRN

## 2018-04-11 MED ORDER — TERBUTALINE SULFATE 1 MG/ML IJ SOLN
0.2500 mg | Freq: Once | INTRAMUSCULAR | Status: DC | PRN
  Filled 2018-04-11: qty 1

## 2018-04-11 MED ORDER — SOD CITRATE-CITRIC ACID 500-334 MG/5ML PO SOLN: 30.0000 mL | ORAL | Status: DC | PRN

## 2018-04-11 MED ORDER — FLEET ENEMA 7-19 GM/118ML RE ENEM: 1.0000 | ENEMA | Freq: Once | RECTAL | Status: DC

## 2018-04-11 NOTE — Progress Notes (Signed)
Pt informed that the ultrasound is considered a limited OB ultrasound and is not intended to be a complete ultrasound exam.  Patient also informed that the ultrasound is not being completed with the intent of assessing for fetal or placental anomalies or any pelvic abnormalities.  Explained that the purpose of today's ultrasound is to assess for  presentation.  Patient acknowledges the purpose of the exam and the limitations of the study.  Vertex by us and exam.

## 2018-04-11 NOTE — Progress Notes (Signed)
Subjective: Breathing with contractions, Family in room.     Objective: BP 126/83   Pulse 72   Temp 97.8 F (36.6 C) (Oral)   Resp 18   Ht 5\' 7"  (1.702 m)   Wt 88.5 kg (195 lb 0.7 oz)   LMP 07/06/2017   SpO2 100% Comment: ra  BMI 30.55 kg/m  No intake/output data recorded. No intake/output data recorded.  FHT: Category 1 FHT 145 variability accels no decels,  UC:   regular, every 2 minutes SVE:   Dilation: 2 Effacement (%): 100 Station: -1 Exam by:: Kathalene FramesEllis Greer CNM   Assessment:  G1P1001 40.2 IUP active labor Cat 1 GBS positive  Plan: Anticipate SVD  Kenney HousemanNancy Jean Chastin Garlitz CNM, MSN 04/11/2018, 9:45 PM

## 2018-04-11 NOTE — Progress Notes (Addendum)
G1@ 40.[redacted] wksga. Here for r/o SROM at 0200 with mucus bloody show. States was a big gush and now cont leaking. Clear. States baby moves a lot during the night and less during the day.   GBS + NKDA  Plan for Natural delivery at this time declines epidural.   Copious amount of fluid noted in vaginal area.  1410: Fern test done:   1440: Provider notified. Report status of pt given. Orders received to start IV and draw labs  1502: labs drawn and IV started with LR. Labs sent after verifying specimen with RN Leota SauersKatherine Wilson.

## 2018-04-11 NOTE — MAU Note (Signed)
Weyman Rodneyyia Bojarski is a 22 y.o. at 6332w2d here in MAU reporting:  +contractions +LOF/mucus plug--patient unsure of which states still come out Onset of complaint: noon Pain score: 6/10 Vitals:   04/11/18 1343  BP: (!) 145/87  Pulse: 94  Resp: 18  SpO2: 100%      Lab orders placed from triage: none
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
## 2018-04-12 ENCOUNTER — Encounter (HOSPITAL_COMMUNITY): Payer: Self-pay | Admitting: *Deleted

## 2018-04-12 ENCOUNTER — Inpatient Hospital Stay (HOSPITAL_COMMUNITY): Payer: BLUE CROSS/BLUE SHIELD | Admitting: Anesthesiology

## 2018-04-12 ENCOUNTER — Other Ambulatory Visit: Payer: Self-pay

## 2018-04-12 LAB — RPR: RPR: NONREACTIVE

## 2018-04-12 MED ORDER — ONDANSETRON HCL 4 MG/2ML IJ SOLN: 4.0000 mg | INTRAMUSCULAR | Status: DC | PRN

## 2018-04-12 MED ORDER — ONDANSETRON HCL 4 MG PO TABS: 4.0000 mg | ORAL_TABLET | ORAL | Status: DC | PRN

## 2018-04-12 MED ORDER — ACETAMINOPHEN 325 MG PO TABS
650.0000 mg | ORAL_TABLET | ORAL | Status: DC | PRN
  Administered 2018-04-13: 650 mg via ORAL
  Filled 2018-04-12: qty 2

## 2018-04-12 MED ORDER — EPHEDRINE 5 MG/ML INJ
10.0000 mg | INTRAVENOUS | Status: DC | PRN
  Filled 2018-04-12: qty 2

## 2018-04-12 MED ORDER — WITCH HAZEL-GLYCERIN EX PADS: 1.0000 "application " | MEDICATED_PAD | CUTANEOUS | Status: DC | PRN

## 2018-04-12 MED ORDER — FENTANYL 2.5 MCG/ML BUPIVACAINE 1/10 % EPIDURAL INFUSION (WH - ANES)
INTRAMUSCULAR | Status: AC
  Filled 2018-04-12: qty 100

## 2018-04-12 MED ORDER — DIPHENHYDRAMINE HCL 25 MG PO CAPS: 25.0000 mg | ORAL_CAPSULE | Freq: Four times a day (QID) | ORAL | Status: DC | PRN

## 2018-04-12 MED ORDER — DIPHENHYDRAMINE HCL 50 MG/ML IJ SOLN: 12.5000 mg | INTRAMUSCULAR | Status: DC | PRN

## 2018-04-12 MED ORDER — IBUPROFEN 600 MG PO TABS
600.0000 mg | ORAL_TABLET | Freq: Four times a day (QID) | ORAL | Status: DC
  Administered 2018-04-12 – 2018-04-14 (×8): 600 mg via ORAL
  Filled 2018-04-12 (×8): qty 1

## 2018-04-12 MED ORDER — SIMETHICONE 80 MG PO CHEW: 80.0000 mg | CHEWABLE_TABLET | ORAL | Status: DC | PRN

## 2018-04-12 MED ORDER — TETANUS-DIPHTH-ACELL PERTUSSIS 5-2.5-18.5 LF-MCG/0.5 IM SUSP: 0.5000 mL | Freq: Once | INTRAMUSCULAR | Status: DC

## 2018-04-12 MED ORDER — FENTANYL 2.5 MCG/ML BUPIVACAINE 1/10 % EPIDURAL INFUSION (WH - ANES)
14.0000 mL/h | INTRAMUSCULAR | Status: DC | PRN
  Administered 2018-04-12 (×2): 14 mL/h via EPIDURAL
  Filled 2018-04-12: qty 100

## 2018-04-12 MED ORDER — PHENYLEPHRINE 40 MCG/ML (10ML) SYRINGE FOR IV PUSH (FOR BLOOD PRESSURE SUPPORT)
80.0000 ug | PREFILLED_SYRINGE | INTRAVENOUS | Status: DC | PRN
  Administered 2018-04-12 (×2): 80 ug via INTRAVENOUS
  Filled 2018-04-12: qty 5

## 2018-04-12 MED ORDER — PHENYLEPHRINE 40 MCG/ML (10ML) SYRINGE FOR IV PUSH (FOR BLOOD PRESSURE SUPPORT)
PREFILLED_SYRINGE | INTRAVENOUS | Status: AC
  Administered 2018-04-12: 80 ug via INTRAVENOUS
  Filled 2018-04-12: qty 10

## 2018-04-12 MED ORDER — ZOLPIDEM TARTRATE 5 MG PO TABS: 5.0000 mg | ORAL_TABLET | Freq: Every evening | ORAL | Status: DC | PRN

## 2018-04-12 MED ORDER — PRENATAL MULTIVITAMIN CH
1.0000 | ORAL_TABLET | Freq: Every day | ORAL | Status: DC
  Administered 2018-04-13: 1 via ORAL
  Filled 2018-04-12: qty 1

## 2018-04-12 MED ORDER — PHENYLEPHRINE 40 MCG/ML (10ML) SYRINGE FOR IV PUSH (FOR BLOOD PRESSURE SUPPORT)
80.0000 ug | PREFILLED_SYRINGE | INTRAVENOUS | Status: DC | PRN
  Filled 2018-04-12: qty 5

## 2018-04-12 MED ORDER — LACTATED RINGERS IV SOLN
500.0000 mL | Freq: Once | INTRAVENOUS | Status: AC
  Administered 2018-04-12: 500 mL via INTRAVENOUS

## 2018-04-12 MED ORDER — SENNOSIDES-DOCUSATE SODIUM 8.6-50 MG PO TABS
2.0000 | ORAL_TABLET | ORAL | Status: DC
  Administered 2018-04-12 – 2018-04-13 (×2): 2 via ORAL
  Filled 2018-04-12 (×2): qty 2

## 2018-04-12 MED ORDER — LIDOCAINE HCL (PF) 1 % IJ SOLN
INTRAMUSCULAR | Status: DC | PRN
  Administered 2018-04-12: 13 mL via EPIDURAL

## 2018-04-12 MED ORDER — DIBUCAINE 1 % RE OINT: 1.0000 "application " | TOPICAL_OINTMENT | RECTAL | Status: DC | PRN

## 2018-04-12 MED ORDER — BENZOCAINE-MENTHOL 20-0.5 % EX AERO
1.0000 "application " | INHALATION_SPRAY | CUTANEOUS | Status: DC | PRN
  Filled 2018-04-12: qty 56

## 2018-04-12 MED ORDER — COCONUT OIL OIL
1.0000 "application " | TOPICAL_OIL | Status: DC | PRN
  Filled 2018-04-12: qty 120

## 2018-04-12 NOTE — Progress Notes (Signed)
Subjective: Pt is comfortable with epidural.    Objective: BP (!) 96/55   Pulse 78   Temp 98.1 F (36.7 C) (Oral)   Resp 16   Ht 5\' 7"  (1.702 m)   Wt 88.5 kg (195 lb 0.7 oz)   LMP 07/06/2017   SpO2 94%   BMI 30.55 kg/m  No intake/output data recorded. No intake/output data recorded.  FHT: Category 1 FHT BL 125 accels noted, occ variable decel UC:   regular, every 2-3 minutes SVE:   Lip/100/+2 Pitocin at 8 mu   Assessment:  40.3 IUP in active labor Cat 1 strip GBS positive  Plan: Anticipate SVD  Kenney HousemanNancy Jean Khiara Shuping CNM, MSN 04/12/2018, 6:28 AM

## 2018-04-12 NOTE — Anesthesia Preprocedure Evaluation (Signed)
Anesthesia Evaluation  Patient identified by MRN, date of birth, ID band Patient awake    Reviewed: Allergy & Precautions, H&P , NPO status , Patient's Chart, lab work & pertinent test results  History of Anesthesia Complications Negative for: history of anesthetic complications  Airway Mallampati: II  TM Distance: >3 FB Neck ROM: full    Dental no notable dental hx.    Pulmonary neg pulmonary ROS,    Pulmonary exam normal breath sounds clear to auscultation       Cardiovascular negative cardio ROS Normal cardiovascular exam Rhythm:regular Rate:Normal     Neuro/Psych negative neurological ROS     GI/Hepatic negative GI ROS, Neg liver ROS,   Endo/Other  negative endocrine ROS  Renal/GU negative Renal ROS     Musculoskeletal   Abdominal   Peds  Hematology negative hematology ROS (+)   Anesthesia Other Findings   Reproductive/Obstetrics negative OB ROS (+) Pregnancy                             Anesthesia Physical  Anesthesia Plan  ASA: II  Anesthesia Plan: Epidural   Post-op Pain Management:    Induction:   PONV Risk Score and Plan:   Airway Management Planned:   Additional Equipment:   Intra-op Plan:   Post-operative Plan:   Informed Consent: I have reviewed the patients History and Physical, chart, labs and discussed the procedure including the risks, benefits and alternatives for the proposed anesthesia with the patient or authorized representative who has indicated his/her understanding and acceptance.   Dental Advisory Given  Plan Discussed with: Anesthesiologist, CRNA and Surgeon  Anesthesia Plan Comments:         Anesthesia Quick Evaluation

## 2018-04-12 NOTE — Anesthesia Postprocedure Evaluation (Signed)
Anesthesia Post Note  Patient: Weyman Rodneyyia Michalsky  Procedure(s) Performed: AN AD HOC LABOR EPIDURAL     Patient location during evaluation: Mother Baby Anesthesia Type: Epidural Level of consciousness: awake and alert and oriented Pain management: satisfactory to patient Vital Signs Assessment: post-procedure vital signs reviewed and stable Respiratory status: respiratory function stable Cardiovascular status: stable Postop Assessment: no headache, no backache, epidural receding, patient able to bend at knees, no signs of nausea or vomiting and adequate PO intake Anesthetic complications: no    Last Vitals:  Vitals:   04/12/18 1303 04/12/18 1734  BP: 109/74 116/76  Pulse: 91 72  Resp: 18 18  Temp: 36.9 C 36.7 C  SpO2:      Last Pain:  Vitals:   04/12/18 1734  TempSrc: Oral  PainSc:    Pain Goal:                 Nathalie Cavendish

## 2018-04-12 NOTE — Progress Notes (Signed)
Subjective: Pt breathing with contractions.  Family support in room Objective: BP 108/68   Pulse 74   Temp 97.7 F (36.5 C) (Axillary)   Resp 18   Ht 5\' 7"  (1.702 m)   Wt 88.5 kg (195 lb 0.7 oz)   LMP 07/06/2017   SpO2 100%   BMI 30.55 kg/m  No intake/output data recorded. No intake/output data recorded.  FHT: Category 1 UC:   regular, every 3-6 minutes SVE:   2/90/0  Assessment:  G1P1001 40.2 IUP active labor Cat 1 GBS positive Plan: Start pitocin, discussed with pt, use, mec of action. Kenney HousemanNancy Jean Evett Kassa CNM, MSN 04/12/2018, 1:45 AM

## 2018-04-12 NOTE — Lactation Note (Signed)
This note was copied from a baby's chart. Lactation Consultation Note  Patient Name: Kendra [name]: 04/12/2018 Reason for consult: Initial assessment;Primapara;1st time breastfeeding;Term  P1 mother whose infant is now 3312 hours old.  Baby has recently had her bath and is sleeping STS on mother's chest.  Mother concerned that baby is not awakening to eat.  I reviewed newborn behavior, voids/stools, feeding cues and STS with mother.  Answered basic questions related to breastfeeding.  Encouraged mother to feed 8-12 times/24 hours of more if baby shows feeding cues.  Continue STS and watch baby for signs of hunger.  Mother more relaxed after discussing baby basics.  She will call for assistance as needed tonight.  Mom made aware of O/P services, breastfeeding support groups, community resources, and our phone # for post-discharge questions.    Maternal Data Formula Feeding for Exclusion: No Does the patient have breastfeeding experience prior to this delivery?: No  Feeding    LATCH Score                   Interventions    Lactation Tools Discussed/Used WIC Program: Yes   Consult Status Consult Status: Follow-up [name]: 04/13/18 Follow-up type: In-patient    Noelia Lenart R Elvin Banker 04/12/2018, 10:23 PM

## 2018-04-12 NOTE — Anesthesia Procedure Notes (Signed)
Epidural Patient location during procedure: OB Start time: 04/12/2018 12:53 AM End time: 04/12/2018 1:07 AM  Staffing Anesthesiologist: Lowella CurbMiller, Morad Tal Ray, MD Performed: anesthesiologist   Preanesthetic Checklist Completed: patient identified, site marked, surgical consent, pre-op evaluation, timeout performed, IV checked, risks and benefits discussed and monitors and equipment checked  Epidural Patient position: sitting Prep: ChloraPrep Patient monitoring: heart rate, cardiac monitor, continuous pulse ox and blood pressure Approach: midline Location: L2-L3 Injection technique: LOR saline  Needle:  Needle type: Tuohy  Needle gauge: 17 G Needle length: 9 cm Needle insertion depth: 5 cm Catheter type: closed end flexible Catheter size: 20 Guage Catheter at skin depth: 9 cm Test dose: negative  Assessment Events: blood not aspirated, injection not painful, no injection resistance, negative IV test and no paresthesia  Additional Notes Reason for block:procedure for pain

## 2018-04-13 LAB — CBC
HEMATOCRIT: 26.9 % — AB (ref 36.0–46.0)
HEMOGLOBIN: 9.1 g/dL — AB (ref 12.0–15.0)
MCH: 30.7 pg (ref 26.0–34.0)
MCHC: 33.8 g/dL (ref 30.0–36.0)
MCV: 90.9 fL (ref 78.0–100.0)
Platelets: 167 10*3/uL (ref 150–400)
RBC: 2.96 MIL/uL — ABNORMAL LOW (ref 3.87–5.11)
RDW: 13.6 % (ref 11.5–15.5)
WBC: 14.8 10*3/uL — AB (ref 4.0–10.5)

## 2018-04-13 NOTE — Progress Notes (Signed)
Post Partum Day 1 Subjective: no complaints, up ad lib, voiding and tolerating PO  Objective: Blood pressure 118/72, pulse 80, temperature 98.1 F (36.7 C), temperature source Oral, resp. rate 16, height 5\' 7"  (1.702 m), weight 195 lb 0.7 oz (88.5 kg), last menstrual period 07/06/2017, SpO2 94 %, unknown if currently breastfeeding.  Physical Exam:  General: alert, cooperative and appears stated age Lochia: appropriate Uterine Fundus: firm Incision:  DVT Evaluation: No evidence of DVT seen on physical exam.  Recent Labs    04/11/18 1502 04/13/18 0532  HGB 11.2* 9.1*  HCT 33.9* 26.9*    Assessment/Plan: Plan for discharge tomorrow and Breastfeeding   LOS: 2 days   Lori A Clemmons CNM 04/13/2018, 10:52 AM

## 2018-04-13 NOTE — Lactation Note (Signed)
This note was copied from a baby's chart. Lactation Consultation Note  Patient Name: Kendra [name] ZOXWR'UToday's Date: 04/13/2018 Reason for consult: Follow-up assessment;Difficult latch;Term;1st time breastfeeding;Primapara  Follow Up For Latch Assistance:  Mother unsure of how to latch properly and I offered to assist.  Mother accepted.  Positioned mother in bed and latched baby in the football hold on the left breast without difficulty. Mother was able to express a couple drops of colostrum.  Mother's breasts are soft and non tender with everted nipples bilaterally.  Infant had wide mouth with flanged lips.  Occasional audible swallows were noted.  Taught and demonstrated breast compressions during feeds.  Mother stated no pain with feeding.  Encouraged to feed 8-12 times/24 hours or more if she shows feeding cues.  Continue STS, breast massage and hand expression after feedings.  Father present but playing a video game on tv.  When I told him to look at his daughter breastfeeding he said, "All I want to do is play my game."  I praised mother for being so excited about learning to breastfeed.  She will call for assistance as needed.       Maternal Data Formula Feeding for Exclusion: No Has patient been taught Hand Expression?: Yes Does the patient have breastfeeding experience prior to this delivery?: No  Feeding Feeding Type: Breast Fed Length of feed: 12 min(still feeding when I left the room)  LATCH Score Latch: Grasps breast easily, tongue down, lips flanged, rhythmical sucking.  Audible Swallowing: A few with stimulation  Type of Nipple: Everted at rest and after stimulation  Comfort (Breast/Nipple): Soft / non-tender  Hold (Positioning): Assistance needed to correctly position infant at breast and maintain latch.  LATCH Score: 8  Interventions Interventions: Breast feeding basics reviewed;Assisted with latch;Skin to skin;Breast massage;Hand express;Position  options;Support pillows;Adjust position;Breast compression  Lactation Tools Discussed/Used WIC Program: Yes   Consult Status Consult Status: Follow-up Date: 04/14/18 Follow-up type: In-patient    Tedra Coppernoll R Kamela Blansett 04/13/2018, 1:49 AM

## 2018-04-13 NOTE — Lactation Note (Signed)
This note was copied from a baby's chart. Lactation Consultation Note: Mother is eating her meal she has multiple family members at the bedside. Mother reports that infant is feeding well. Mother denies having any questions or concerns about breastfeeding. Suggested that mother  to page for assistance with the next feeding as needed.   Patient Name: Kendra [name] Reason for consult: Follow-up assessment   Maternal Data    Feeding Feeding Type: Breast Fed Length of feed: 40 min  LATCH Score Latch: Repeated attempts needed to sustain latch, nipple held in mouth throughout feeding, stimulation needed to elicit sucking reflex.  Audible Swallowing: A few with stimulation  Type of Nipple: Everted at rest and after stimulation  Comfort (Breast/Nipple): Soft / non-tender  Hold (Positioning): No assistance needed to correctly position infant at breast.  LATCH Score: 8  Interventions    Lactation Tools Discussed/Used     Consult Status Consult Status: Follow-up Date: 04/13/18 Follow-up type: In-patient    Stevan BornKendrick, Gladstone Rosas [facility] [name], 3:11 PM

## 2018-04-14 ENCOUNTER — Ambulatory Visit: Payer: Self-pay

## 2018-04-14 MED ORDER — MEASLES, MUMPS & RUBELLA VAC ~~LOC~~ INJ
0.5000 mL | INJECTION | Freq: Once | SUBCUTANEOUS | Status: AC
  Administered 2018-04-14: 0.5 mL via SUBCUTANEOUS
  Filled 2018-04-14: qty 0.5

## 2018-04-14 NOTE — Lactation Note (Signed)
This note was copied from a baby's chart. Lactation Consultation Note: Mother reports that infant is breastfeeding well. Mother reports that her nipples have become slightly sore. She is having difficulty getting infant to open her mouth wide. Tips for latching infant on using cross cradle and football hold.  Advised mother to continue to cue base feed and feed infant at least 8-12 times in 24 hours. Discussed cluster feeding.  Discussed treatment and prevention of engorgement. Staff nurse to provide a hand pump for mother. Mother has a DEBP at home.  Mother encouraged to follow up with [facility] and informed of outpatient dept services.  Patient Name: Kendra [name] [id number]'BToday's Date: 04/14/2018     Maternal Data    Feeding    LATCH Score                   Interventions    Lactation Tools Discussed/Used     Consult Status      Michel BickersKendrick, Takenya Travaglini McCoy 04/14/2018, 4:33 PM

## 2018-04-14 NOTE — Discharge Summary (Signed)
SVD OB Discharge Summary     Patient Name: Kendra [name] DOB: [birth year] MRN: [id number]  Date of admission: 04/11/2018 Delivering MD: Ranee Gosselin K  Date of delivery: 04/12/2018 Type of delivery: SVD  Newborn Data: Sex: BG Circumcision: NO Live born female  Birth Weight: 7 lb 5.5 oz (3330 g) APGAR: 9, 9  Newborn Delivery   Birth date/time:  04/12/2018 09:27:00 Delivery type:  Vaginal, Spontaneous     Feeding: breast Infant being discharge to home with mother in stable condition.   Admitting diagnosis: 69 WKS, CTXS Intrauterine pregnancy: [redacted]w[redacted]d    Secondary diagnosis:  Active Problems:   PROM (premature rupture of membranes)                                Complications: None                                                              Intrapartum Procedures: spontaneous vaginal delivery and GBS prophylaxis Postpartum Procedures: MMR Complications-Operative and Postpartum: 1st degree perineal laceration Augmentation: Pitocin   History of Present Illness: Kendra [name] a 22y.o. female, G1P1001, who presents at 454w3deeks gestation. [facility]  Her pregnancy has been complicated by:  @PROB   Hospital course:  Onset of Labor With Vaginal Delivery     [age].o. yo G1P1001 at 4069w3ds admitted in Latent Labor on 04/11/2018. Patient had an uncomplicated labor course as follows:  Membrane Rupture Time/Date: 2:00 AM ,04/11/2018   Intrapartum Procedures: Episiotomy: None [1]                                         Lacerations:  1st degree [2];Labial [10]  Patient had a delivery of a Viable infant. 04/12/2018  Information for the patient's newborn:  [name], [name]3[[id number]]elivery Method: Vag-Spont    Pateint had an uncomplicated postpartum course.  She is ambulating, tolerating a regular diet, passing flatus, and urinating well. Patient is discharged home in stable condition on  04/14/18.  Postpartum Day # 3 : S/P NSVD. Patient up ad lib, denies syncope or dizziness. Reports consuming regular diet without issues and denies N/V. Patient reports 2 bowel movement + passing flatus.  Denies issues with urination and reports bleeding is "small."  Patient is Breastfeeding and reports going well.  Desires undecided for postpartum contraception.  Pain is being appropriately managed with use of motrin/tylenol.   Physical exam  Vitals:   04/13/18 0500 04/13/18 1442 04/13/18 2242 04/14/18 0620  BP: 118/72 125/77 124/88 126/74  Pulse: 80 65 70 68  Resp: 16 18 18 18   Temp: 98.1 F (36.7 C) (!) 97.5 F (36.4 C) 97.9 F (36.6 C) 98 F (36.7 C)  TempSrc: Oral Oral Axillary Oral  SpO2:  100%    Weight:      Height:       General: alert, cooperative and no distress Lochia: appropriate Uterine Fundus: firm Perineum: 1st degree , approx, healing, no drainage no bleeding.  DVT Evaluation: No evidence of DVT seen on  physical exam. Negative Homan's sign. No cords or calf tenderness. No significant calf/ankle edema.  Labs: Lab Results  Component Value Date   WBC 14.8 (H) 04/13/2018   HGB 9.1 (L) 04/13/2018   HCT 26.9 (L) 04/13/2018   MCV 90.9 04/13/2018   PLT 167 04/13/2018   CMP Latest Ref Rng & Units 04/11/2018  Glucose 70 - 99 mg/dL 87  BUN 6 - 20 mg/dL 8  Creatinine 0.44 - 1.00 mg/dL 0.62  Sodium 135 - 145 mmol/L 133(L)  Potassium 3.5 - 5.1 mmol/L 3.6  Chloride 98 - 111 mmol/L 104  CO2 22 - 32 mmol/L 19(L)  Calcium 8.9 - 10.3 mg/dL 8.9  Total Protein 6.5 - 8.1 g/dL 7.0  Total Bilirubin 0.3 - 1.2 mg/dL 0.1(L)  Alkaline Phos 38 - 126 U/L 145(H)  AST 15 - 41 U/L 24  ALT 0 - 44 U/L 12    Date of discharge: 04/14/2018 Discharge Diagnoses: Term Pregnancy-delivered and anemia Discharge instruction: per After Visit Summary and "Baby and Me Booklet".  After visit meds:  Allergies as of 04/14/2018   No Known Allergies     Medication List    TAKE these medications    IRON PO Take 1 tablet by mouth daily.   prenatal multivitamin Tabs tablet Take 1 tablet by mouth daily at 12 noon.       Activity:           pelvic rest Advance as tolerated. Pelvic rest for 6 weeks.  Diet:                routine Medications: PNV, Ibuprofen, Colace and Iron Postpartum contraception: Undecided Condition:  Pt discharge to home with baby in stable Anemia: PO Iron  Meds: Allergies as of 04/14/2018   No Known Allergies     Medication List    TAKE these medications   IRON PO Take 1 tablet by mouth daily.   prenatal multivitamin Tabs tablet Take 1 tablet by mouth daily at 12 noon.       Discharge Follow Up:  [facility] Follow up in 6 week(s).   Specialty:  Obstetrics and Gynecology Contact information: [address] [place] [address] [place], NP-C, [place] 04/14/2018, 11:47 AM  Noralyn Pick, FNP

## 2018-04-19 ENCOUNTER — Inpatient Hospital Stay (HOSPITAL_COMMUNITY): Payer: BLUE CROSS/BLUE SHIELD

## 2018-04-30 DIAGNOSIS — Z5320 Procedure and treatment not carried out because of patient's decision for unspecified reasons: Secondary | ICD-10-CM | POA: Diagnosis not present

## 2018-04-30 DIAGNOSIS — O1093 Unspecified pre-existing hypertension complicating the puerperium: Secondary | ICD-10-CM | POA: Diagnosis not present

## 2018-04-30 DIAGNOSIS — O115 Pre-existing hypertension with pre-eclampsia, complicating the puerperium: Secondary | ICD-10-CM | POA: Diagnosis not present

## 2018-04-30 DIAGNOSIS — O1495 Unspecified pre-eclampsia, complicating the puerperium: Secondary | ICD-10-CM | POA: Diagnosis not present

## 2018-05-01 DIAGNOSIS — O165 Unspecified maternal hypertension, complicating the puerperium: Secondary | ICD-10-CM | POA: Diagnosis not present

## 2018-05-01 DIAGNOSIS — Z3A00 Weeks of gestation of pregnancy not specified: Secondary | ICD-10-CM | POA: Diagnosis not present

## 2018-05-01 DIAGNOSIS — N390 Urinary tract infection, site not specified: Secondary | ICD-10-CM | POA: Diagnosis not present

## 2018-05-01 DIAGNOSIS — O8620 Urinary tract infection following delivery, unspecified: Secondary | ICD-10-CM | POA: Diagnosis not present

## 2018-06-01 DIAGNOSIS — R300 Dysuria: Secondary | ICD-10-CM | POA: Diagnosis not present

## 2018-06-01 DIAGNOSIS — R208 Other disturbances of skin sensation: Secondary | ICD-10-CM | POA: Diagnosis not present

## 2018-07-03 DIAGNOSIS — Z3040 Encounter for surveillance of contraceptives, unspecified: Secondary | ICD-10-CM | POA: Diagnosis not present

## 2018-07-10 DIAGNOSIS — Z3049 Encounter for surveillance of other contraceptives: Secondary | ICD-10-CM | POA: Diagnosis not present

## 2018-08-13 DIAGNOSIS — B977 Papillomavirus as the cause of diseases classified elsewhere: Secondary | ICD-10-CM | POA: Diagnosis not present

## 2018-08-13 DIAGNOSIS — Z01419 Encounter for gynecological examination (general) (routine) without abnormal findings: Secondary | ICD-10-CM | POA: Diagnosis not present

## 2018-08-13 DIAGNOSIS — Z6827 Body mass index (BMI) 27.0-27.9, adult: Secondary | ICD-10-CM | POA: Diagnosis not present

## 2018-08-13 DIAGNOSIS — R87610 Atypical squamous cells of undetermined significance on cytologic smear of cervix (ASC-US): Secondary | ICD-10-CM | POA: Diagnosis not present

## 2018-10-01 DIAGNOSIS — Z3009 Encounter for other general counseling and advice on contraception: Secondary | ICD-10-CM | POA: Diagnosis not present

## 2018-11-04 DIAGNOSIS — R591 Generalized enlarged lymph nodes: Secondary | ICD-10-CM | POA: Diagnosis not present

## 2018-11-16 DIAGNOSIS — R590 Localized enlarged lymph nodes: Secondary | ICD-10-CM | POA: Diagnosis not present

## 2018-11-16 DIAGNOSIS — Z3040 Encounter for surveillance of contraceptives, unspecified: Secondary | ICD-10-CM | POA: Diagnosis not present

## 2018-12-24 DIAGNOSIS — Z3009 Encounter for other general counseling and advice on contraception: Secondary | ICD-10-CM | POA: Diagnosis not present

## 2020-10-07 NOTE — L&D Delivery Note (Signed)
Delivery Note Labor onset:   Labor Onset Time: 2100 Complete dilation at 5:47 AM  Onset of pushing at 0547 FHR second stage Cat 2 Analgesia/Anesthesia intrapartum: epidural  Guided pushing with maternal urge. Delivery of a viable Female "Kendra [name]" at [phone]. Fetal head delivered in ROA position.  Nuchal cord: x 1 loose, reduced.  Infant placed on maternal abd, dried, and tactile stim. Spontaneous cry. Cord double clamped after pulsation ceased and cut by FOB.  3 RNs present for birth.  Cord blood sample collected: yes Arterial cord blood sample collected: N/A  Placenta delivered Kendra [name] via Kendra [name] Maneuver, intact, with 3 VC.  AMTSL Placenta to L&D. Uterine tone firm, bleeding minimal  No laceration identified.  Anesthesia: epidural Repair: N/A QBL/EBL (mL): 150 Complications: Nuchal x 1 APGAR: APGAR (1 MIN): 9   APGAR (5 MINS): 9   APGAR (10 MINS):   Mom to postpartum.  Baby to Couplet care / Skin to Skin.  Kendra [name] Kendra Covel MSN, CNM 02/28/2021, 6:18 AM

## 2020-10-18 DIAGNOSIS — Z3491 Encounter for supervision of normal pregnancy, unspecified, first trimester: Secondary | ICD-10-CM | POA: Diagnosis not present

## 2020-10-18 DIAGNOSIS — Z0184 Encounter for antibody response examination: Secondary | ICD-10-CM | POA: Diagnosis not present

## 2020-10-18 DIAGNOSIS — Z363 Encounter for antenatal screening for malformations: Secondary | ICD-10-CM | POA: Diagnosis not present

## 2020-10-18 DIAGNOSIS — Z3A20 20 weeks gestation of pregnancy: Secondary | ICD-10-CM | POA: Diagnosis not present

## 2020-10-18 DIAGNOSIS — Z22330 Carrier of Group B streptococcus: Secondary | ICD-10-CM | POA: Diagnosis not present

## 2020-10-18 DIAGNOSIS — Z331 Pregnant state, incidental: Secondary | ICD-10-CM | POA: Diagnosis not present

## 2021-02-11 LAB — OB RESULTS CONSOLE GBS: GBS: NEGATIVE

## 2021-02-28 ENCOUNTER — Encounter (HOSPITAL_COMMUNITY): Payer: Self-pay | Admitting: Obstetrics & Gynecology

## 2021-02-28 ENCOUNTER — Other Ambulatory Visit: Payer: Self-pay

## 2021-02-28 ENCOUNTER — Inpatient Hospital Stay (HOSPITAL_COMMUNITY): Payer: Medicaid Other | Admitting: Anesthesiology

## 2021-02-28 ENCOUNTER — Inpatient Hospital Stay (HOSPITAL_COMMUNITY)
Admission: AD | Admit: 2021-02-28 | Discharge: 2021-03-02 | DRG: 806 | Disposition: A | Discharge status: Home / Self Care | Payer: Medicaid Other | Attending: Obstetrics and Gynecology | Admitting: Obstetrics and Gynecology

## 2021-02-28 DIAGNOSIS — Z3A39 39 weeks gestation of pregnancy: Secondary | ICD-10-CM

## 2021-02-28 DIAGNOSIS — F1290 Cannabis use, unspecified, uncomplicated: Secondary | ICD-10-CM | POA: Diagnosis present

## 2021-02-28 DIAGNOSIS — Z20822 Contact with and (suspected) exposure to covid-19: Secondary | ICD-10-CM | POA: Diagnosis present

## 2021-02-28 DIAGNOSIS — O26893 Other specified pregnancy related conditions, third trimester: Secondary | ICD-10-CM | POA: Diagnosis present

## 2021-02-28 DIAGNOSIS — O99324 Drug use complicating childbirth: Secondary | ICD-10-CM | POA: Diagnosis present

## 2021-02-28 LAB — CBC
HCT: 33.5 % — ABNORMAL LOW (ref 36.0–46.0)
Hemoglobin: 11 g/dL — ABNORMAL LOW (ref 12.0–15.0)
MCH: 29.4 pg (ref 26.0–34.0)
MCHC: 32.8 g/dL (ref 30.0–36.0)
MCV: 89.6 fL (ref 80.0–100.0)
Platelets: 182 10*3/uL (ref 150–400)
RBC: 3.74 MIL/uL — ABNORMAL LOW (ref 3.87–5.11)
RDW: 12.2 % (ref 11.5–15.5)
WBC: 8.1 10*3/uL (ref 4.0–10.5)
nRBC: 0 % (ref 0.0–0.2)

## 2021-02-28 LAB — RESP PANEL BY RT-PCR (FLU A&B, COVID) ARPGX2
Influenza A by PCR: NEGATIVE
Influenza B by PCR: NEGATIVE
SARS Coronavirus 2 by RT PCR: NEGATIVE

## 2021-02-28 LAB — TYPE AND SCREEN
ABO/RH(D): O POS
Antibody Screen: NEGATIVE

## 2021-02-28 LAB — RPR: RPR Ser Ql: NONREACTIVE

## 2021-02-28 MED ORDER — FLEET ENEMA 7-19 GM/118ML RE ENEM: 1.0000 | ENEMA | RECTAL | Status: DC | PRN

## 2021-02-28 MED ORDER — COCONUT OIL OIL: 1.0000 "application " | TOPICAL_OIL | Status: DC | PRN

## 2021-02-28 MED ORDER — FENTANYL CITRATE (PF) 100 MCG/2ML IJ SOLN
INTRAMUSCULAR | Status: AC
  Administered 2021-02-28: 50 ug via INTRAVENOUS
  Filled 2021-02-28: qty 2

## 2021-02-28 MED ORDER — FENTANYL-BUPIVACAINE-NACL 0.5-0.125-0.9 MG/250ML-% EP SOLN
12.0000 mL/h | EPIDURAL | Status: DC | PRN
  Administered 2021-02-28: 12 mL/h via EPIDURAL
  Filled 2021-02-28: qty 250

## 2021-02-28 MED ORDER — OXYTOCIN-SODIUM CHLORIDE 30-0.9 UT/500ML-% IV SOLN
2.5000 [IU]/h | INTRAVENOUS | Status: DC
  Administered 2021-02-28: 2.5 [IU]/h via INTRAVENOUS
  Filled 2021-02-28: qty 500

## 2021-02-28 MED ORDER — SOD CITRATE-CITRIC ACID 500-334 MG/5ML PO SOLN: 30.0000 mL | ORAL | Status: DC | PRN

## 2021-02-28 MED ORDER — PHENYLEPHRINE 40 MCG/ML (10ML) SYRINGE FOR IV PUSH (FOR BLOOD PRESSURE SUPPORT): 80.0000 ug | PREFILLED_SYRINGE | INTRAVENOUS | Status: DC | PRN

## 2021-02-28 MED ORDER — IBUPROFEN 600 MG PO TABS
600.0000 mg | ORAL_TABLET | Freq: Four times a day (QID) | ORAL | Status: DC
  Administered 2021-02-28 – 2021-03-02 (×8): 600 mg via ORAL
  Filled 2021-02-28 (×8): qty 1

## 2021-02-28 MED ORDER — LACTATED RINGERS IV SOLN: INTRAVENOUS | Status: DC

## 2021-02-28 MED ORDER — LIDOCAINE HCL (PF) 1 % IJ SOLN: 30.0000 mL | INTRAMUSCULAR | Status: DC | PRN

## 2021-02-28 MED ORDER — OXYTOCIN BOLUS FROM INFUSION
333.0000 mL | Freq: Once | INTRAVENOUS | Status: AC
  Administered 2021-02-28: 333 mL via INTRAVENOUS

## 2021-02-28 MED ORDER — ACETAMINOPHEN 325 MG PO TABS
650.0000 mg | ORAL_TABLET | ORAL | Status: DC | PRN
  Administered 2021-02-28 – 2021-03-01 (×3): 650 mg via ORAL
  Filled 2021-02-28 (×3): qty 2

## 2021-02-28 MED ORDER — FENTANYL CITRATE (PF) 100 MCG/2ML IJ SOLN
100.0000 ug | Freq: Once | INTRAMUSCULAR | Status: AC
  Administered 2021-02-28: 100 ug via INTRAVENOUS
  Filled 2021-02-28: qty 2

## 2021-02-28 MED ORDER — PRENATAL MULTIVITAMIN CH
1.0000 | ORAL_TABLET | Freq: Every day | ORAL | Status: DC
  Administered 2021-03-01: 1 via ORAL
  Filled 2021-02-28: qty 1

## 2021-02-28 MED ORDER — OXYCODONE-ACETAMINOPHEN 5-325 MG PO TABS
2.0000 | ORAL_TABLET | ORAL | Status: DC | PRN
Start: 2021-02-28 — End: 2021-02-28

## 2021-02-28 MED ORDER — ONDANSETRON HCL 4 MG PO TABS: 4.0000 mg | ORAL_TABLET | ORAL | Status: DC | PRN

## 2021-02-28 MED ORDER — DIPHENHYDRAMINE HCL 50 MG/ML IJ SOLN: 12.5000 mg | INTRAMUSCULAR | Status: DC | PRN

## 2021-02-28 MED ORDER — EPHEDRINE 5 MG/ML INJ: 10.0000 mg | INTRAVENOUS | Status: DC | PRN

## 2021-02-28 MED ORDER — DIPHENHYDRAMINE HCL 25 MG PO CAPS: 25.0000 mg | ORAL_CAPSULE | Freq: Four times a day (QID) | ORAL | Status: DC | PRN

## 2021-02-28 MED ORDER — SIMETHICONE 80 MG PO CHEW
80.0000 mg | CHEWABLE_TABLET | ORAL | Status: DC | PRN
Start: 2021-02-28 — End: 2021-03-02
  Administered 2021-03-01: 80 mg via ORAL
  Filled 2021-02-28: qty 1

## 2021-02-28 MED ORDER — ACETAMINOPHEN 325 MG PO TABS: 650.0000 mg | ORAL_TABLET | ORAL | Status: DC | PRN

## 2021-02-28 MED ORDER — FENTANYL CITRATE (PF) 100 MCG/2ML IJ SOLN: 50.0000 ug | Freq: Once | INTRAMUSCULAR | Status: AC

## 2021-02-28 MED ORDER — BENZOCAINE-MENTHOL 20-0.5 % EX AERO
1.0000 "application " | INHALATION_SPRAY | CUTANEOUS | Status: DC | PRN
  Administered 2021-02-28: 1 via TOPICAL
  Filled 2021-02-28: qty 56

## 2021-02-28 MED ORDER — ONDANSETRON HCL 4 MG/2ML IJ SOLN: 4.0000 mg | INTRAMUSCULAR | Status: DC | PRN

## 2021-02-28 MED ORDER — LACTATED RINGERS IV SOLN
500.0000 mL | Freq: Once | INTRAVENOUS | Status: AC
  Administered 2021-02-28: 500 mL via INTRAVENOUS

## 2021-02-28 MED ORDER — LACTATED RINGERS IV SOLN
500.0000 mL | INTRAVENOUS | Status: DC | PRN
  Administered 2021-02-28: 1000 mL via INTRAVENOUS

## 2021-02-28 MED ORDER — OXYCODONE-ACETAMINOPHEN 5-325 MG PO TABS
1.0000 | ORAL_TABLET | ORAL | Status: DC | PRN
Start: 2021-02-28 — End: 2021-02-28

## 2021-02-28 MED ORDER — ZOLPIDEM TARTRATE 5 MG PO TABS: 5.0000 mg | ORAL_TABLET | Freq: Every evening | ORAL | Status: DC | PRN

## 2021-02-28 MED ORDER — DIBUCAINE (PERIANAL) 1 % EX OINT: 1.0000 "application " | TOPICAL_OINTMENT | CUTANEOUS | Status: DC | PRN

## 2021-02-28 MED ORDER — LIDOCAINE HCL (PF) 1 % IJ SOLN
INTRAMUSCULAR | Status: DC | PRN
  Administered 2021-02-28: 8 mL via EPIDURAL

## 2021-02-28 MED ORDER — ONDANSETRON HCL 4 MG/2ML IJ SOLN: 4.0000 mg | Freq: Four times a day (QID) | INTRAMUSCULAR | Status: DC | PRN

## 2021-02-28 MED ORDER — SENNOSIDES-DOCUSATE SODIUM 8.6-50 MG PO TABS
2.0000 | ORAL_TABLET | Freq: Every day | ORAL | Status: DC
  Administered 2021-03-01 – 2021-03-02 (×2): 2 via ORAL
  Filled 2021-02-28 (×3): qty 2

## 2021-02-28 MED ORDER — WITCH HAZEL-GLYCERIN EX PADS: 1.0000 "application " | MEDICATED_PAD | CUTANEOUS | Status: DC | PRN

## 2021-02-28 MED ORDER — FENTANYL-BUPIVACAINE-NACL 0.5-0.125-0.9 MG/250ML-% EP SOLN: 12.0000 mL/h | EPIDURAL | Status: DC | PRN

## 2021-02-28 MED ORDER — TETANUS-DIPHTH-ACELL PERTUSSIS 5-2.5-18.5 LF-MCG/0.5 IM SUSY: 0.5000 mL | PREFILLED_SYRINGE | Freq: Once | INTRAMUSCULAR | Status: DC

## 2021-02-28 NOTE — Anesthesia Preprocedure Evaluation (Signed)
Anesthesia Evaluation  Patient identified by MRN, date of birth, ID band Patient awake    Reviewed: Allergy & Precautions, H&P , NPO status , Patient's Chart, lab work & pertinent test results, reviewed documented beta blocker date and time   Airway Mallampati: I  TM Distance: >3 FB Neck ROM: full    Dental no notable dental hx. (+) Teeth Intact, Dental Advisory Given   Pulmonary neg pulmonary ROS,    Pulmonary exam normal breath sounds clear to auscultation       Cardiovascular + DVT  negative cardio ROS Normal cardiovascular exam Rhythm:regular Rate:Normal     Neuro/Psych negative neurological ROS  negative psych ROS   GI/Hepatic negative GI ROS, Neg liver ROS,   Endo/Other  negative endocrine ROS  Renal/GU negative Renal ROS  negative genitourinary   Musculoskeletal   Abdominal   Peds  Hematology negative hematology ROS (+)   Anesthesia Other Findings   Reproductive/Obstetrics (+) Pregnancy                             Anesthesia Physical Anesthesia Plan  ASA: II  Anesthesia Plan: Epidural   Post-op Pain Management:    Induction:   PONV Risk Score and Plan:   Airway Management Planned: Natural Airway  Additional Equipment: None  Intra-op Plan:   Post-operative Plan:   Informed Consent: I have reviewed the patients History and Physical, chart, labs and discussed the procedure including the risks, benefits and alternatives for the proposed anesthesia with the patient or authorized representative who has indicated his/her understanding and acceptance.     Dental Advisory Given  Plan Discussed with: Anesthesiologist  Anesthesia Plan Comments: (Labs checked- platelets confirmed with RN in room. Fetal heart tracing, per RN, reported to be stable enough for sitting procedure. Discussed epidural, and patient consents to the procedure:  included risk of possible  headache,backache, failed block, allergic reaction, and nerve injury. This patient was asked if she had any questions or concerns before the procedure started.)        Anesthesia Quick Evaluation

## 2021-02-28 NOTE — Anesthesia Postprocedure Evaluation (Signed)
Anesthesia Post Note  Patient: Kendra [name]  Procedure(s) Performed: AN AD HOC LABOR EPIDURAL     Anesthesia Post Evaluation No complications documented.  Last Vitals:  Vitals:   02/28/21 0910 02/28/21 1325  BP: 137/87 122/80  Pulse: (!) 58 61  Resp:  16  Temp: 36.7 C 36.7 C  SpO2: 100%     Last Pain:  Vitals:   02/28/21 1325  TempSrc: Oral  PainSc: 0-No pain   Pain Goal: Patients Stated Pain Goal: 3 (02/28/21 1137)                 Cephus Shelling

## 2021-02-28 NOTE — Anesthesia Procedure Notes (Signed)
Epidural Patient location during procedure: OB Start time: 02/28/2021 4:38 AM End time: 02/28/2021 4:42 AM  Staffing Anesthesiologist: Bethena Midget, MD  Preanesthetic Checklist Completed: patient identified, IV checked, site marked, risks and benefits discussed, surgical consent, monitors and equipment checked, pre-op evaluation and timeout performed  Epidural Patient position: sitting Prep: DuraPrep and site prepped and draped Patient monitoring: continuous pulse ox and blood pressure Approach: midline Location: L3-L4 Injection technique: LOR air  Needle:  Needle type: Tuohy  Needle gauge: 17 G Needle length: 9 cm and 9 Needle insertion depth: 7 cm Catheter type: closed end flexible Catheter size: 19 Gauge Catheter at skin depth: 12 cm Test dose: negative  Assessment Events: blood not aspirated, injection not painful, no injection resistance, no paresthesia and negative IV test
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
## 2021-02-28 NOTE — Lactation Note (Signed)
This note was copied from a baby's chart. Lactation Consultation Note Baby less than hr old at time of consult. Baby rooting. Mom holding him STS. Repositioned baby into cradle position for mom to latch. Assisted w/latch. Took multiple times. Baby off and on frequently. Newborn behavior discussed. Mom wanted to know if she had any colostrum. LC done hand expression w/no colostrum noted at this time. Mom has large breast w/short shaft everted nipple w/stimulation. Mom will be f/u on MBU.  Patient Name: Kendra [name] [id number] Date: 02/28/2021 Reason for consult: L&D Initial assessment;Primapara;Term Age:9 hours  Maternal Data    Feeding    LATCH Score Latch: Repeated attempts needed to sustain latch, nipple held in mouth throughout feeding, stimulation needed to elicit sucking reflex.  Audible Swallowing: None  Type of Nipple: Everted at rest and after stimulation  Comfort (Breast/Nipple): Soft / non-tender  Hold (Positioning): Full assist, staff holds infant at breast  LATCH Score: 5   Lactation Tools Discussed/Used    Interventions    Discharge    Consult Status Consult Status: Follow-up Date: 02/28/21 Follow-up type: In-patient    Charyl Dancer 02/28/2021, 6:50 AM

## 2021-02-28 NOTE — MAU Note (Signed)
Pt reports contractions that are getting stronger and closer together. Denies bleeding or ROM

## 2021-03-01 LAB — CBC
HCT: 31.9 % — ABNORMAL LOW (ref 36.0–46.0)
Hemoglobin: 10.4 g/dL — ABNORMAL LOW (ref 12.0–15.0)
MCH: 28.8 pg (ref 26.0–34.0)
MCHC: 32.6 g/dL (ref 30.0–36.0)
MCV: 88.4 fL (ref 80.0–100.0)
Platelets: 167 10*3/uL (ref 150–400)
RBC: 3.61 MIL/uL — ABNORMAL LOW (ref 3.87–5.11)
RDW: 12.4 % (ref 11.5–15.5)
WBC: 9.9 10*3/uL (ref 4.0–10.5)
nRBC: 0 % (ref 0.0–0.2)

## 2021-03-01 NOTE — Progress Notes (Signed)
Pt c/o heaviness LLquadrant. Hyperactive bowel sounds on assessment. Heating pack given. Pt states she feels her bowels moving.

## 2021-03-02 MED ORDER — IBUPROFEN 600 MG PO TABS: 600.0000 mg | ORAL_TABLET | Freq: Four times a day (QID) | ORAL | 0 refills | Status: AC
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
## 2021-05-04 DIAGNOSIS — R222 Localized swelling, mass and lump, trunk: Secondary | ICD-10-CM | POA: Diagnosis not present

## 2021-05-04 DIAGNOSIS — Z3040 Encounter for surveillance of contraceptives, unspecified: Secondary | ICD-10-CM | POA: Diagnosis not present

## 2021-07-04 DIAGNOSIS — N6331 Unspecified lump in axillary tail of the right breast: Secondary | ICD-10-CM | POA: Diagnosis not present

## 2021-07-04 DIAGNOSIS — Z3040 Encounter for surveillance of contraceptives, unspecified: Secondary | ICD-10-CM | POA: Diagnosis not present

## 2021-07-12 ENCOUNTER — Other Ambulatory Visit: Payer: Self-pay | Admitting: Obstetrics & Gynecology

## 2021-07-12 DIAGNOSIS — N6331 Unspecified lump in axillary tail of the right breast: Secondary | ICD-10-CM

## 2021-08-01 ENCOUNTER — Other Ambulatory Visit: Payer: Self-pay | Admitting: Obstetrics & Gynecology

## 2021-08-01 ENCOUNTER — Other Ambulatory Visit: Payer: Self-pay

## 2021-08-01 ENCOUNTER — Ambulatory Visit
Admission: RE | Admit: 2021-08-01 | Discharge: 2021-08-01 | Disposition: A | Discharge status: Home / Self Care | Payer: Medicaid Other | Source: Ambulatory Visit | Attending: Obstetrics & Gynecology | Admitting: Obstetrics & Gynecology

## 2021-08-01 DIAGNOSIS — N6331 Unspecified lump in axillary tail of the right breast: Secondary | ICD-10-CM

## 2021-10-23 DIAGNOSIS — N760 Acute vaginitis: Secondary | ICD-10-CM | POA: Diagnosis not present

## 2021-10-23 DIAGNOSIS — Z7689 Persons encountering health services in other specified circumstances: Secondary | ICD-10-CM | POA: Diagnosis not present

## 2021-10-23 DIAGNOSIS — Z1159 Encounter for screening for other viral diseases: Secondary | ICD-10-CM | POA: Diagnosis not present

## 2021-10-23 DIAGNOSIS — R809 Proteinuria, unspecified: Secondary | ICD-10-CM | POA: Diagnosis not present

## 2021-10-23 DIAGNOSIS — N6041 Mammary duct ectasia of right breast: Secondary | ICD-10-CM | POA: Diagnosis not present

## 2021-10-23 DIAGNOSIS — N898 Other specified noninflammatory disorders of vagina: Secondary | ICD-10-CM | POA: Diagnosis not present

## 2021-10-23 DIAGNOSIS — Z0000 Encounter for general adult medical examination without abnormal findings: Secondary | ICD-10-CM | POA: Diagnosis not present

## 2021-11-08 DIAGNOSIS — R070 Pain in throat: Secondary | ICD-10-CM | POA: Diagnosis not present

## 2021-11-08 DIAGNOSIS — J069 Acute upper respiratory infection, unspecified: Secondary | ICD-10-CM | POA: Diagnosis not present

## 2021-12-14 DIAGNOSIS — Z0000 Encounter for general adult medical examination without abnormal findings: Secondary | ICD-10-CM | POA: Diagnosis not present

## 2021-12-14 DIAGNOSIS — Z113 Encounter for screening for infections with a predominantly sexual mode of transmission: Secondary | ICD-10-CM | POA: Diagnosis not present

## 2021-12-14 DIAGNOSIS — Z124 Encounter for screening for malignant neoplasm of cervix: Secondary | ICD-10-CM | POA: Diagnosis not present

## 2021-12-14 DIAGNOSIS — N898 Other specified noninflammatory disorders of vagina: Secondary | ICD-10-CM | POA: Diagnosis not present

## 2021-12-14 DIAGNOSIS — Z803 Family history of malignant neoplasm of breast: Secondary | ICD-10-CM | POA: Diagnosis not present

## 2021-12-14 DIAGNOSIS — Z3040 Encounter for surveillance of contraceptives, unspecified: Secondary | ICD-10-CM | POA: Diagnosis not present

## 2021-12-14 DIAGNOSIS — B3731 Acute candidiasis of vulva and vagina: Secondary | ICD-10-CM | POA: Diagnosis not present

## 2022-01-31 ENCOUNTER — Other Ambulatory Visit: Payer: Medicaid Other

## 2022-02-15 ENCOUNTER — Ambulatory Visit
Admission: RE | Admit: 2022-02-15 | Discharge: 2022-02-15 | Disposition: A | Discharge status: Home / Self Care | Payer: Medicaid Other | Source: Ambulatory Visit | Attending: Obstetrics & Gynecology | Admitting: Obstetrics & Gynecology

## 2022-02-15 DIAGNOSIS — N6331 Unspecified lump in axillary tail of the right breast: Secondary | ICD-10-CM

## 2022-04-03 DIAGNOSIS — R1032 Left lower quadrant pain: Secondary | ICD-10-CM | POA: Diagnosis not present

## 2022-04-18 DIAGNOSIS — J0110 Acute frontal sinusitis, unspecified: Secondary | ICD-10-CM | POA: Diagnosis not present

## 2022-04-18 DIAGNOSIS — R109 Unspecified abdominal pain: Secondary | ICD-10-CM | POA: Diagnosis not present

## 2022-04-18 DIAGNOSIS — R070 Pain in throat: Secondary | ICD-10-CM | POA: Diagnosis not present
